# Patient Record
Sex: Female | Born: 1935
Health system: Southern US, Community
[De-identification: ages and names within clinical notes are randomized; demographics above are authoritative.]

## PROBLEM LIST (undated history)

## (undated) DIAGNOSIS — K219 Gastro-esophageal reflux disease without esophagitis: Secondary | ICD-10-CM

## (undated) DIAGNOSIS — J45909 Unspecified asthma, uncomplicated: Secondary | ICD-10-CM

## (undated) DIAGNOSIS — R531 Weakness: Secondary | ICD-10-CM

## (undated) DIAGNOSIS — J449 Chronic obstructive pulmonary disease, unspecified: Secondary | ICD-10-CM

## (undated) DIAGNOSIS — I10 Essential (primary) hypertension: Secondary | ICD-10-CM

## (undated) HISTORY — DX: Weakness: R53.1

## (undated) HISTORY — DX: Chronic obstructive pulmonary disease, unspecified: J44.9

## (undated) HISTORY — DX: Unspecified asthma, uncomplicated: J45.909

## (undated) HISTORY — DX: Gastro-esophageal reflux disease without esophagitis: K21.9

## (undated) HISTORY — DX: Essential (primary) hypertension: I10

## (undated) NOTE — *Deleted (*Deleted)
[facility] Renal Consultation Note    Indication for Consultation:  AKI  JYN:WGNF, Shea Evans, MD  HPI: Rachel Ho is a 81 y.o. female with a past medical history significant for asthma, COPD, HTN, and recent AAA (August 2021) admitted at the urging of her PCP for elevated BUN/Cr. Unknown baseline Cr, but BUN/Cr were found to be 116/4.04 on admission. AKI thought to be in the setting of steady decline of PO intake and overall functional ability to take care of herself. Patient also has been experiencing generalized weakness and 25-lb weight loss since June 2021. She states she has a good appetite and drinks a protein supplement at home. She denies difficulty swallowing or choking with food. She does use dentures. She also endorses taking 1 tablet Advil everyday. She reports the last time she took one was yesterday. Son, who is at bedside, says she recently had surgery for AAA in August but he does not know if she received any antibiotics at that time. She denies using any other drugs other than those listed on her medication list.   She says she saw a Kidney doctor once when she was pregnant because she "couldn't pee." She denies any known family history of kidney disease.  She currently lives alone, though she states "someone is always there."   During this admission, she has been receiving NS IVF @125mL /hr with improvement noted in her BUN and Cr this AM (87/3.36). Renal US performed and showed Bilateral echogenic parenchyma suggestive of chronic renal disease.  Her documented urine output, if accurate, is low. Urine canister was empty at time of visit. She is unable to say how much she has been urinating.  ROS: She endorses intermittent swelling in her lower extremities. She does have a morning cough and feels SOB in the morning, for which she takes her inhalers. She denies CP, orthopnea, nausea, vomiting, diarrhea, dysuria.  Past Medical History:  Diagnosis Date  . Asthma    . COPD (chronic obstructive pulmonary disease) (HCC)   . GERD (gastroesophageal reflux disease)   . High blood pressure    Past Surgical History:  Procedure Laterality Date  . ABDOMINAL HYSTERECTOMY  1984  . HERNIA REPAIR  1984   Family History  Problem Relation Age of Onset  . High blood pressure Mother    Social History:  reports that she quit smoking about 51 years ago. Her smoking use included cigarettes. She has a 50.00 pack-year smoking history. She has never used smokeless tobacco. She reports that she does not drink alcohol and does not use drugs. Allergies  Allergen Reactions  . Sulfa Antibiotics     Patient advised this was several years ago but she's had sulfur since then.    Prior to Admission medications   Medication Sig Start Date End Date Taking? Authorizing Provider  ASPIRIN LOW DOSE 81 MG EC tablet Take 81 mg by mouth daily. 07/10/20  Yes [provider]  ezetimibe (ZETIA) 10 MG tablet Take 10 mg by mouth daily. 09/14/20  Yes [provider]  fluconazole (DIFLUCAN) 100 MG tablet Take 100 mg by mouth daily.   Yes [provider]  furosemide (LASIX) 20 MG tablet Take 20 mg by mouth in the morning.  04/18/16  Yes [provider]  metoprolol (LOPRESSOR) 50 MG tablet Take 50 mg by mouth 2 (two) times daily.  05/03/16  Yes [provider]  olmesartan (BENICAR) 40 MG tablet Take 40 mg by mouth daily.  05/03/16  Yes  [provider]  Pitavastatin Calcium (LIVALO) 1 MG TABS Take 1 mg by mouth daily.   Yes [provider]  potassium chloride SA (K-DUR,KLOR-CON) 20 MEQ tablet Take 20 mEq by mouth daily.  07/14/16  Yes [provider]  PROAIR HFA 108 (90 Base) MCG/ACT inhaler Inhale two puffs every four to six hours as needed for cough or wheeze. Patient taking differently: Inhale 2 puffs into the lungs See admin instructions. Inhale two puffs every four to six hours as needed for cough or wheeze. 11/07/17  Yes  Kozlow, Alvira Philips, MD  SYMBICORT 160-4.5 MCG/ACT inhaler INHALE 2 PUFFS BY MOUTH 2 TIMES A DAY Patient taking differently: Inhale 2 puffs into the lungs in the morning and at bedtime.  04/07/17  Yes Kozlow, Alvira Philips, MD  tolterodine (DETROL LA) 2 MG 24 hr capsule Take 2 mg by mouth daily. 11/20/17  Yes [provider]  Cholecalciferol (VITAMIN D PO) Take by mouth daily.    [provider]  esomeprazole (NEXIUM) 40 MG capsule TAKE ONE CAPSULE BY MOUTH EVERY DAY Patient taking differently: Take 40 mg by mouth daily.  02/01/18   Kozlow, Alvira Philips, MD  Fluocinolone Acetonide 0.01 % OIL Place 1 drop in ear(s) daily as needed.    [provider]  fluticasone (FLONASE) 50 MCG/ACT nasal spray Place 2 sprays into both nostrils daily. 10/31/16   Kozlow, Alvira Philips, MD  LINZESS 145 MCG CAPS capsule as needed.  06/13/16   [provider]  loratadine (CLARITIN) 10 MG tablet Take 10 mg by mouth daily as needed for allergies.    [provider]  LUMIGAN 0.01 % SOLN  05/26/16   [provider]  mometasone (ELOCON) 0.1 % cream Apply to affected areas once daily until resolved Patient taking differently: Apply 1 application topically See admin instructions. Apply to affected areas once daily until resolved 01/25/17   Kozlow, Alvira Philips, MD  Multiple Vitamins-Calcium (ONE-A-DAY WOMENS PO) Take by mouth daily.    [provider]  nitroGLYCERIN (NITROSTAT) 0.4 MG SL tablet Place 0.4 mg under the tongue.    [provider]  oxybutynin (DITROPAN) 5 MG tablet  05/03/16   [provider]  rosuvastatin (CRESTOR) 10 MG tablet Take 10 mg by mouth daily. 07/10/20   [provider]  sodium chloride (SALINE MIST) 0.65 % nasal spray Place 1 spray into the nose as needed for congestion.    [provider]   Current Facility-Administered Medications  Medication Dose Route Frequency Provider Last Rate Last Admin  . 0.9 %  sodium chloride infusion    Intravenous Continuous Glade Lloyd, MD 125 mL/hr at 09/16/20 0600 New Bag at 09/16/20 0600  . albuterol (PROVENTIL) (2.5 MG/3ML) 0.083% nebulizer solution 2.5 mg  2.5 mg Inhalation Q6H PRN Cox, Amy N, DO      . aspirin EC tablet 81 mg  81 mg Oral Daily Cox, Amy N, DO   81 mg at 09/16/20 1055  . heparin injection 5,000 Units  5,000 Units Subcutaneous Q8H Cox, Amy N, DO   5,000 Units at 09/16/20 0538  . hydrALAZINE (APRESOLINE) injection 10 mg  10 mg Intravenous Q6H PRN Cox, Amy N, DO      . metoprolol tartrate (LOPRESSOR) tablet 50 mg  50 mg Oral BID Cox, Amy N, DO   50 mg at 09/15/20 2026  . nitroGLYCERIN (NITROSTAT) SL tablet 0.4 mg  0.4 mg Sublingual Q5 min PRN Cox, Amy N, DO      .  pantoprazole (PROTONIX) EC tablet 40 mg  40 mg Oral Daily Cox, Amy N, DO   40 mg at 09/16/20 1055   Labs: Basic Metabolic Panel: Recent Labs  Lab 09/14/20 1823 09/14/20 1823 09/15/20 0118 09/15/20 0819 09/16/20 0822  NA 137  --   --  142 141  K 5.0  --   --  4.6 3.7  CL 109  --   --  113* 115*  CO2 13*  --   --  19* 17*  GLUCOSE 130*  --   --  195* 107*  BUN 116*  --   --  112* 87*  CREATININE 4.04*   < > 4.14* 4.13* 3.36*  CALCIUM 12.0*  --   --  11.4* 10.0   < > = values in this interval not displayed.    Recent Labs  Lab 09/16/20 0822  AMMONIA 32   CBC: Recent Labs  Lab 09/14/20 1823 09/15/20 0118 09/16/20 0822  WBC 7.7 6.9 5.8  NEUTROABS  --   --  3.9  HGB 12.1 12.7 10.6*  HCT 37.9 39.8 33.2*  MCV 82.0 81.9 83.6  PLT 178 183 144*   Studies/Results: CT HEAD WO CONTRAST  Result Date: 09/15/2020 CLINICAL DATA:  66 year old female with delirium. EXAM: CT HEAD WITHOUT CONTRAST TECHNIQUE: Contiguous axial images were obtained from the base of the skull through the vertex without intravenous contrast. COMPARISON:  [facility] head CT 08/11/2019. FINDINGS: Brain: No midline shift, ventriculomegaly, mass effect, evidence of mass lesion, intracranial hemorrhage or evidence of  cortically based acute infarction. Cavum vergae, normal variant. Mild for age scattered white matter hypodensity. Otherwise normal gray-white matter differentiation. No cortical encephalomalacia identified. Vascular: Mild Calcified atherosclerosis at the skull base. No suspicious intracranial vascular hyperdensity. Skull: No acute osseous abnormality identified. Sinuses/Orbits: Visualized paranasal sinuses and mastoids are stable and well pneumatized. Other: No acute orbit or scalp soft tissue findings. IMPRESSION: 1. No acute intracranial abnormality. 2. Mild for age cerebral white matter changes, most commonly due to small vessel disease. Electronically Signed   By: Odessa Fleming M.D.   On: 09/15/2020 15:07   US Renal  Result Date: 09/15/2020 CLINICAL DATA:  Acute renal injury EXAM: RENAL / URINARY TRACT ULTRASOUND COMPLETE COMPARISON:  CT abdomen pelvis 10/23/2019. FINDINGS: Right Kidney: Renal measurements: 9.4 x 3.4 x 5.1 = volume: 84 mL. Increased echogenicity with prominent medullary pyramids. There is a 1.5 cm cystic lesion within the right kidney that is grossly stable in size compared to prior CT 2020. No solid mass or hydronephrosis visualized. Left Kidney: Renal measurements: 8.5 x 4.4 x 4 = volume: 79 mL. Increased echogenicity with prominent medullary pyramids. There is a 3.8 cm cystic lesion within the left kidney that is grossly similar slightly decreased in size compared to prior CT 2020. No solid mass or hydronephrosis visualized. Urinary bladder: Appears normal for degree of bladder distention. Other: None. IMPRESSION: 1. Bilateral echogenic parenchyma suggestive of chronic renal disease. 2. Otherwise grossly unremarkable renal ultrasound. Electronically Signed   By: Tish Frederickson M.D.   On: 09/15/2020 01:55    ROS: All others negative except those listed in HPI.  Physical Exam: Vitals:   09/15/20 1254 09/15/20 1621 09/16/20 0029 09/16/20 0540  BP: (!) 148/64 138/63 (!) 150/61 (!) 166/65   Pulse: 65 (!) 53 (!) 51 (!) 52  Resp: 13 14 16 18   Temp: 97.9 F (36.6 C) 98.5 F (36.9 C) 97.8 F (36.6 C) 97.6 F (36.4 C)  TempSrc: Oral  Oral Oral Oral  SpO2: 97% 96% 99% 99%  Weight:    45.6 kg  Height:         General: Elderly, frail African-American female sitting up in bed eating her lunch in NAD. Head: NCAT. Sclera not icteric. Moist mucus membranes. Neck: Supple. No lymphadenopathy Lungs: CTA bilaterally, anteriorly. No wheeze, rales or rhonchi. Breathing is unlabored. Heart: RRR. No murmur, rubs or gallops.  Abdomen: Soft, flat, and nontender, +BS, no guarding, no rebound tenderness.  Lower extremities: Trace edema in bilateral lower extremities. No ischemic changes or open wounds  Neuro: AAOx2. Moves all extremities spontaneously. Psych: Appropriate affect and cheerful mood. Responds to questions inappropriately at times.   Assessment/Plan: 1. AKI - likely pre-renal in the setting of dehydration and concurrent ARB/NSAID use. Improving with rehydration (4.04 --> 3.36). Continue to rehydrate with IVF @ 16mL/hr. Switch to LR to prevent further acidosis. Awaiting baseline labs from Dr. Juleen China (PCP). Will obtain urine studies.  2. Hypercalcemia - 12.0 on admission, 10.0 today. In the setting of increasing age and weight loss, will obtain SPEP and free light chains. Also PTH, Ca, Phos. 3. Hypertension/Volume - on Metoprolol 50mg  BID and Hydralazine PRN. Continue to hold Olmesartan and Furosemide while in AKI. Management per primary. 4. Chronic Anemia - Hgb 12.1 on admission, likely hemoconcentration. Down to 10.6 today. Management per primary.  Wendall Stade, PA-S2 [facility] 09/16/2020, 12:10 PM

## (undated) NOTE — *Deleted (*Deleted)
[facility] Progress Note   Subjective:   Patient seen and evaluated at bedside. She continues to feel better with each day. Her renal function is improving, Cr down to 2.62 today. Improving UOP. BP has been elevated the past day. On Metoprolol and prn Hydralazine.  Denies CP, HA, SOB, n/v/d.  Objective Vitals:   09/17/20 2031 09/18/20 0030 09/18/20 0603 09/18/20 0955  BP: (!) 149/62 (!) 169/72 (!) 151/131 (!) 148/63  Pulse: 69 (!) 59 66 64  Resp:  16 16   Temp:  97.6 F (36.4 C) 97.9 F (36.6 C)   TempSrc:  Oral Oral   SpO2: 100% 100% 98% 95%  Weight:      Height:       Physical Exam General:Elderly, frail African-American female lying down in bed in NAD. Head:NCAT. Sclera not icteric. Moist mucus membranes. Has temporal wasting Neck: Supple. No lymphadenopathy Lungs: CTA bilaterally, anteriorly. No wheeze, rales or rhonchi. Breathing is unlabored. Heart:RRR. No murmur, rubs or gallops.  Abdomen: Soft, flat, and nontender, +BS, no guarding, no rebound tenderness.  Lower extremities:Trace edema in bilateral lower extremities. No ischemic changes or open wounds  Neuro: AAOx2. Moves all extremities spontaneously. Psych: Appropriate affect and mood. Responds to questions inappropriately at times.   Filed Weights   09/14/20 1812 09/16/20 0540 09/17/20 0506  Weight: 54.4 kg 45.6 kg 47.6 kg    Intake/Output Summary (Last 24 hours) at 09/18/2020 1109 Last data filed at 09/18/2020 0659 Gross per 24 hour  Intake 1297.79 ml  Output 902 ml  Net 395.79 ml    Additional Objective Labs: Basic Metabolic Panel: Recent Labs  Lab 09/16/20 0822 09/16/20 1420 09/17/20 0035 09/18/20 0259  NA 141  --  143 139  K 3.7  --  4.1 3.7  CL 115*  --  116* 109  CO2 17*  --  18* 19*  GLUCOSE 107*  --  77 85  BUN 87*  --  79* 57*  CREATININE 3.36*  --  3.12* 2.62*  CALCIUM 10.0 9.3 9.7 9.6  PHOS  --  2.4*  --   --    Liver Function Tests: Recent Labs  Lab  09/17/20 0035  AST 87*  ALT 42  ALKPHOS 52  BILITOT 0.2*  PROT 5.9*  ALBUMIN 2.5*   CBC: Recent Labs  Lab 09/14/20 1823 09/14/20 1823 09/15/20 0118 09/16/20 0822 09/18/20 0259  WBC 7.7  --  6.9 5.8  --   NEUTROABS  --   --   --  3.9  --   HGB 12.1   < > 12.7 10.6* 10.2*  HCT 37.9   < > 39.8 33.2* 32.3*  MCV 82.0  --  81.9 83.6  --   PLT 178  --  183 144*  --    < > = values in this interval not displayed.   Medications: . lactated ringers 75 mL/hr at 09/17/20 1947   . aspirin EC  81 mg Oral Daily  . feeding supplement  237 mL Oral BID BM  . heparin  5,000 Units Subcutaneous Q8H  . levothyroxine  25 mcg Oral Q0600  . metoprolol tartrate  50 mg Oral BID  . pantoprazole  40 mg Oral Daily    Dialysis Orders:  Assessment/Plan: 38F with AKI with baseline Cr 0.9 in June 2021; UA w/o features of sig glomerular disease; and unintentional weight loss / FTT. Was hyercalcemic on admission. Since admit hydrated with NS, held diuretic/ARB. SCr and BUN improved from  admission. I suspect has a hypovolemic component but also some degree of CKD. Myeloma eval sent. Hold ARB and NSAIDs.  1. AKI -Improving with hydration and holding ARB. May stop LR now that patient is improving PO intake. Encourage adequate hydration. Continue to monitor RFP daily. Imaging w/o acute structural issues but a degree of medical renal disease. 2. Hypercalcemia mild/moderate - improving. Calcium 9.6 today. sFLC elevated consistent with low GFR. Pending SPEP. No sig cytopenias. PTH  - WNL.Phosphorous low at 2.5 - Improving. Gamma gap 3.4 - not elevated. 3. HTN - BP elevated, continue Metoprolol and PRN Hydralazine. Consider adding Amlodipine as needed. Continue holding ARB and Lasix. 4. Elevated TSH- newly discovered this admission. Started on synthroid. Management per primary. 5. COPD 6. FTT, unintentional weight loss - RD following. Pt likes food here, continue to optimize nutrition. 7. ? AAA  Wendall Stade, PA-S2 [facility] 09/18/2020,11:09 AM  LOS: 3 days

## (undated) NOTE — *Deleted (*Deleted)
[facility] Progress Note   Subjective:   Patient seen and examined at bedside. She is in good spirits and feels she is "on the mend." Her BUN/Cr have improved - down to 79/3.12 today. UOP only in the last 24hrs - question accuracy. She likes the "good food" here and reports good appetite.  Denies CP, SOB, nausea, vomiting, headache, diarrhea.  Objective Vitals:   09/16/20 1821 09/17/20 0025 09/17/20 0506 09/17/20 0929  BP: (!) 127/53 (!) 161/67 (!) 166/73   Pulse: 62 (!) 59 (!) 59 71  Resp: 16 18 18    Temp: (!) 97.3 F (36.3 C) 98.6 F (37 C) 98.4 F (36.9 C)   TempSrc: Oral Oral Oral   SpO2: 100% 100% 100%   Weight:   47.6 kg   Height:       Physical Exam General: Elderly, frail African-American female sitting up in bed eating her lunch in NAD. Head: NCAT. Sclera not icteric. Moist mucus membranes. Has temporal wasting Neck: Supple. No lymphadenopathy Lungs: CTA bilaterally, anteriorly. No wheeze, rales or rhonchi. Breathing is unlabored. Heart: RRR. No murmur, rubs or gallops.  Abdomen: Soft, flat, and nontender, +BS, no guarding, no rebound tenderness.  Lower extremities: Trace edema in bilateral lower extremities. No ischemic changes or open wounds  Neuro: AAOx2. Moves all extremities spontaneously. Psych:  Appropriate affect and mood. Responds to questions inappropriately at times.  Filed Weights   09/14/20 1812 09/16/20 0540 09/17/20 0506  Weight: 54.4 kg 45.6 kg 47.6 kg    Intake/Output Summary (Last 24 hours) at 09/17/2020 1157 Last data filed at 09/17/2020 0500 Gross per 24 hour  Intake 1355.11 ml  Output 200 ml  Net 1155.11 ml    Additional Objective Labs: Basic Metabolic Panel: Recent Labs  Lab 09/15/20 0819 09/15/20 0819 09/16/20 0822 09/16/20 1420 09/17/20 0035  NA 142  --  141  --  143  K 4.6  --  3.7  --  4.1  CL 113*  --  115*  --  116*  CO2 19*  --  17*  --  18*  GLUCOSE 195*  --  107*  --  77  BUN 112*  --  87*  --   79*  CREATININE 4.13*  --  3.36*  --  3.12*  CALCIUM 11.4*   < > 10.0 9.3 9.7  PHOS  --   --   --  2.4*  --    < > = values in this interval not displayed.   Liver Function Tests: Recent Labs  Lab 09/17/20 0035  AST 87*  ALT 42  ALKPHOS 52  BILITOT 0.2*  PROT 5.9*  ALBUMIN 2.5*   CBC: Recent Labs  Lab 09/14/20 1823 09/15/20 0118 09/16/20 0822  WBC 7.7 6.9 5.8  NEUTROABS  --   --  3.9  HGB 12.1 12.7 10.6*  HCT 37.9 39.8 33.2*  MCV 82.0 81.9 83.6  PLT 178 183 144*   Studies/Results: CT HEAD WO CONTRAST  Result Date: 09/15/2020 CLINICAL DATA:  37 year old female with delirium. EXAM: CT HEAD WITHOUT CONTRAST TECHNIQUE: Contiguous axial images were obtained from the base of the skull through the vertex without intravenous contrast. COMPARISON:  [facility] head CT 08/11/2019. FINDINGS: Brain: No midline shift, ventriculomegaly, mass effect, evidence of mass lesion, intracranial hemorrhage or evidence of cortically based acute infarction. Cavum vergae, normal variant. Mild for age scattered white matter hypodensity. Otherwise normal gray-white matter differentiation. No cortical encephalomalacia identified. Vascular: Mild Calcified atherosclerosis at the  skull base. No suspicious intracranial vascular hyperdensity. Skull: No acute osseous abnormality identified. Sinuses/Orbits: Visualized paranasal sinuses and mastoids are stable and well pneumatized. Other: No acute orbit or scalp soft tissue findings. IMPRESSION: 1. No acute intracranial abnormality. 2. Mild for age cerebral white matter changes, most commonly due to small vessel disease. Electronically Signed   By: Odessa Fleming M.D.   On: 09/15/2020 15:07    Medications: . lactated ringers 75 mL/hr at 09/17/20 0440   . aspirin EC  81 mg Oral Daily  . feeding supplement  237 mL Oral BID BM  . heparin  5,000 Units Subcutaneous Q8H  . metoprolol tartrate  50 mg Oral BID  . pantoprazole  40 mg Oral Daily    Assessment/Plan:  62F with AKI with baseline Cr 0.9 in June 2021; UA w/o features of sig glomerular disease; and unintentional weight loss / FTT.  Was hyercalcemic on admission. Since admit hydrated with NS, held diuretic/ARB.  SCr and BUN improved from admission. I suspect has a hypovolemic component but also some degree of CKD. Myeloma eval sent. Hold ARB and NSAIDs.   1. AKI - Improving with hydration and holding ARB. Continue LR for another 24hrs and monitor RFP daily. Imaging w/o acute structural issues but a degree of medical renal disease. UA w/o hematuria/proteinuria. 2. Hypercalcemia mild/moderate - improving. Corrected calcium 10.9 today. Sent SPEP, sFLC.  No sig cytopenias. PTH and PTHrP - WNL. Phosphorous low at 2.5 - suspect this is nutritional. Gamma gap 3.4 - not elevated. 3. HTN - BP ok here, holding meds 4. Hypothyroidism - newly discovered this admission. Management per primary. 5. COPD 6. FTT, unintentional weight loss - RD following. Pt likes food here, continue to optimize nutrition. 7. ? AAA  Wendall Stade, PA-S2 [facility] 09/17/2020,11:57 AM  LOS: 2 days

---

## 1982-12-05 HISTORY — PX: ABDOMINAL HYSTERECTOMY: SHX81

## 1982-12-05 HISTORY — PX: HERNIA REPAIR: SHX51

## 2014-12-08 DIAGNOSIS — J309 Allergic rhinitis, unspecified: Secondary | ICD-10-CM | POA: Diagnosis not present

## 2014-12-08 DIAGNOSIS — L853 Xerosis cutis: Secondary | ICD-10-CM | POA: Diagnosis not present

## 2014-12-08 DIAGNOSIS — J4540 Moderate persistent asthma, uncomplicated: Secondary | ICD-10-CM | POA: Diagnosis not present

## 2014-12-08 DIAGNOSIS — L300 Nummular dermatitis: Secondary | ICD-10-CM | POA: Diagnosis not present

## 2015-01-09 DIAGNOSIS — R233 Spontaneous ecchymoses: Secondary | ICD-10-CM | POA: Diagnosis not present

## 2015-01-19 DIAGNOSIS — R300 Dysuria: Secondary | ICD-10-CM | POA: Diagnosis not present

## 2015-01-20 DIAGNOSIS — R300 Dysuria: Secondary | ICD-10-CM | POA: Diagnosis not present

## 2015-01-26 DIAGNOSIS — Z1231 Encounter for screening mammogram for malignant neoplasm of breast: Secondary | ICD-10-CM | POA: Diagnosis not present

## 2015-01-30 DIAGNOSIS — R928 Other abnormal and inconclusive findings on diagnostic imaging of breast: Secondary | ICD-10-CM | POA: Diagnosis not present

## 2015-03-02 DIAGNOSIS — J45909 Unspecified asthma, uncomplicated: Secondary | ICD-10-CM | POA: Diagnosis not present

## 2015-03-02 DIAGNOSIS — E782 Mixed hyperlipidemia: Secondary | ICD-10-CM | POA: Diagnosis not present

## 2015-03-02 DIAGNOSIS — Z79899 Other long term (current) drug therapy: Secondary | ICD-10-CM | POA: Diagnosis not present

## 2015-03-02 DIAGNOSIS — I10 Essential (primary) hypertension: Secondary | ICD-10-CM | POA: Diagnosis not present

## 2015-03-02 DIAGNOSIS — Z0000 Encounter for general adult medical examination without abnormal findings: Secondary | ICD-10-CM | POA: Diagnosis not present

## 2015-03-02 DIAGNOSIS — Z9181 History of falling: Secondary | ICD-10-CM | POA: Diagnosis not present

## 2015-03-05 DIAGNOSIS — H5210 Myopia, unspecified eye: Secondary | ICD-10-CM | POA: Diagnosis not present

## 2015-03-05 DIAGNOSIS — H40011 Open angle with borderline findings, low risk, right eye: Secondary | ICD-10-CM | POA: Diagnosis not present

## 2015-03-05 DIAGNOSIS — H5203 Hypermetropia, bilateral: Secondary | ICD-10-CM | POA: Diagnosis not present

## 2015-03-09 DIAGNOSIS — J45901 Unspecified asthma with (acute) exacerbation: Secondary | ICD-10-CM | POA: Diagnosis not present

## 2015-04-09 DIAGNOSIS — H9209 Otalgia, unspecified ear: Secondary | ICD-10-CM | POA: Diagnosis not present

## 2015-04-09 DIAGNOSIS — H6123 Impacted cerumen, bilateral: Secondary | ICD-10-CM | POA: Diagnosis not present

## 2015-04-09 DIAGNOSIS — M2660 Temporomandibular joint disorder, unspecified: Secondary | ICD-10-CM | POA: Diagnosis not present

## 2015-06-03 DIAGNOSIS — B379 Candidiasis, unspecified: Secondary | ICD-10-CM | POA: Diagnosis not present

## 2015-06-15 DIAGNOSIS — E785 Hyperlipidemia, unspecified: Secondary | ICD-10-CM | POA: Insufficient documentation

## 2015-06-15 DIAGNOSIS — I10 Essential (primary) hypertension: Secondary | ICD-10-CM | POA: Diagnosis not present

## 2015-06-17 DIAGNOSIS — Z961 Presence of intraocular lens: Secondary | ICD-10-CM | POA: Diagnosis not present

## 2015-06-17 DIAGNOSIS — H4011X2 Primary open-angle glaucoma, moderate stage: Secondary | ICD-10-CM | POA: Diagnosis not present

## 2015-07-06 DIAGNOSIS — J309 Allergic rhinitis, unspecified: Secondary | ICD-10-CM | POA: Diagnosis not present

## 2015-07-06 DIAGNOSIS — J4540 Moderate persistent asthma, uncomplicated: Secondary | ICD-10-CM | POA: Diagnosis not present

## 2015-09-09 DIAGNOSIS — H401222 Low-tension glaucoma, left eye, moderate stage: Secondary | ICD-10-CM | POA: Diagnosis not present

## 2015-09-09 DIAGNOSIS — H401212 Low-tension glaucoma, right eye, moderate stage: Secondary | ICD-10-CM | POA: Diagnosis not present

## 2015-09-16 DIAGNOSIS — J309 Allergic rhinitis, unspecified: Secondary | ICD-10-CM | POA: Insufficient documentation

## 2015-09-16 DIAGNOSIS — J310 Chronic rhinitis: Secondary | ICD-10-CM | POA: Insufficient documentation

## 2015-09-16 DIAGNOSIS — J449 Chronic obstructive pulmonary disease, unspecified: Secondary | ICD-10-CM | POA: Insufficient documentation

## 2015-09-16 DIAGNOSIS — K219 Gastro-esophageal reflux disease without esophagitis: Secondary | ICD-10-CM | POA: Insufficient documentation

## 2015-09-16 HISTORY — DX: Chronic rhinitis: J31.0

## 2015-09-16 HISTORY — DX: Allergic rhinitis, unspecified: J30.9

## 2015-10-07 DIAGNOSIS — H401212 Low-tension glaucoma, right eye, moderate stage: Secondary | ICD-10-CM | POA: Diagnosis not present

## 2015-10-07 DIAGNOSIS — Z961 Presence of intraocular lens: Secondary | ICD-10-CM | POA: Diagnosis not present

## 2015-10-07 DIAGNOSIS — H401222 Low-tension glaucoma, left eye, moderate stage: Secondary | ICD-10-CM | POA: Diagnosis not present

## 2015-12-08 DIAGNOSIS — R7309 Other abnormal glucose: Secondary | ICD-10-CM | POA: Diagnosis not present

## 2015-12-08 DIAGNOSIS — E782 Mixed hyperlipidemia: Secondary | ICD-10-CM | POA: Diagnosis not present

## 2015-12-08 DIAGNOSIS — Z79899 Other long term (current) drug therapy: Secondary | ICD-10-CM | POA: Diagnosis not present

## 2015-12-08 DIAGNOSIS — I10 Essential (primary) hypertension: Secondary | ICD-10-CM | POA: Diagnosis not present

## 2015-12-23 DIAGNOSIS — I10 Essential (primary) hypertension: Secondary | ICD-10-CM | POA: Diagnosis not present

## 2015-12-23 DIAGNOSIS — E785 Hyperlipidemia, unspecified: Secondary | ICD-10-CM | POA: Diagnosis not present

## 2016-01-13 DIAGNOSIS — H401222 Low-tension glaucoma, left eye, moderate stage: Secondary | ICD-10-CM | POA: Diagnosis not present

## 2016-01-13 DIAGNOSIS — H401212 Low-tension glaucoma, right eye, moderate stage: Secondary | ICD-10-CM | POA: Diagnosis not present

## 2016-01-18 ENCOUNTER — Encounter: Payer: Self-pay | Admitting: Allergy and Immunology

## 2016-01-18 ENCOUNTER — Ambulatory Visit (INDEPENDENT_AMBULATORY_CARE_PROVIDER_SITE_OTHER): Payer: Medicare Other | Admitting: Allergy and Immunology

## 2016-01-18 VITALS — BP 140/78 | HR 74 | Resp 16 | Ht 62.99 in | Wt 137.1 lb

## 2016-01-18 DIAGNOSIS — J45909 Unspecified asthma, uncomplicated: Secondary | ICD-10-CM | POA: Diagnosis not present

## 2016-01-18 DIAGNOSIS — J387 Other diseases of larynx: Secondary | ICD-10-CM | POA: Diagnosis not present

## 2016-01-18 DIAGNOSIS — J3089 Other allergic rhinitis: Secondary | ICD-10-CM

## 2016-01-18 DIAGNOSIS — J449 Chronic obstructive pulmonary disease, unspecified: Secondary | ICD-10-CM | POA: Diagnosis not present

## 2016-01-18 DIAGNOSIS — K219 Gastro-esophageal reflux disease without esophagitis: Secondary | ICD-10-CM

## 2016-01-18 NOTE — Patient Instructions (Signed)
  1. Continue Symbicort 160 - 2 inhalations twice a day  2. Continue nasal fluticasone one spray each nostril one-2 times a day  3. Continue Nexium 40 mg in the morning  4. May add ranitidine 300 mg in the evening if needed  5. May add Claritin 10 mg once a day if needed  6. May add Xopenex HFA 2 puffs every 4-6 hours if needed  7. Return to clinic in 6 months or earlier if problem

## 2016-01-18 NOTE — Progress Notes (Signed)
Follow-up Note  Referring Provider: No ref. provider found Primary Provider: Ronita Hipps, MD Date of Office Visit: 01/18/2016  Subjective:   Rachel Ho (DOB: January 28, 1935) is a 81 y.o. female who returns to the [facility] on 01/18/2016 in re-evaluation of the following:  HPI Comments: Rachel Ho presents this clinic in reevaluation of her obstructive lung disease with component of asthma, allergic and nonallergic rhinitis, and LPR. I've not seen her in his clinic in 6 months.  Rachel Ho is done very well on her current medical therapy. She's had very little problems with her breathing while using Symbicort 160 2 inhalations mostly one time per day but occasionally twice a day. Rarely does she use Xopenex HFA and she can exert herself without any problem and she has not acquired any systemic steroid to treat an exacerbation these past 6 months. She did receive the flu vaccine this year and she received her Prevnar booster in 2015.  Rachel Ho is had very little problems with her nose as long as she continues to use nasal fluticasone every day. If she runs out of nasal fluticasone she'll use over-the-counter Rhinocort.  Rachel Ho is had very good control of her reflux or using her Nexium 40 mg in the morning and she is no longer using any ranitidine at nighttime.   Current Outpatient Prescriptions on File Prior to Visit  Medication Sig Dispense Refill  . budesonide-formoterol (SYMBICORT) 160-4.5 MCG/ACT inhaler Inhale 2 puffs into the lungs 2 (two) times daily.    Marland Kitchen esomeprazole (NEXIUM) 40 MG capsule Take 40 mg by mouth daily at 12 noon.    . Fluocinolone Acetonide 0.01 % OIL Place 1 drop in ear(s) daily as needed.    . fluticasone (VERAMYST) 27.5 MCG/SPRAY nasal spray Place 1 spray into the nose daily.    . FUROSEMIDE PO Take by mouth.    . levalbuterol (XOPENEX HFA) 45 MCG/ACT inhaler Inhale 2 puffs into the lungs every 4 (four) hours as needed for wheezing.    Marland Kitchen loratadine  (CLARITIN) 10 MG tablet Take 10 mg by mouth daily as needed for allergies.    Marland Kitchen METOPROLOL TARTRATE PO Take by mouth.    . montelukast (SINGULAIR) 10 MG tablet Take 10 mg by mouth daily. Reported on 01/18/2016    . Olmesartan Medoxomil (BENICAR PO) Take by mouth.    . OXYBUTYNIN TD Place onto the skin.    Marland Kitchen POTASSIUM PO Take by mouth daily.    . ranitidine (ZANTAC) 300 MG tablet Take 300 mg by mouth daily as needed for heartburn.    . sodium chloride (SALINE MIST) 0.65 % nasal spray Place 1 spray into the nose as needed for congestion.     No current facility-administered medications on file prior to visit.     Past Medical History  Diagnosis Date  . Asthma   . GERD (gastroesophageal reflux disease)   . High blood pressure     Past Surgical History  Procedure Laterality Date  . Abdominal hysterectomy  1984  . Hernia repair  1984    Allergies  Allergen Reactions  . Sulfa Antibiotics     Review of systems negative except as noted in HPI / PMHx or noted below:  Review of Systems  Constitutional: Negative.   HENT: Positive for hearing loss (Scheduled for a hearing evaluation this coming month).   Eyes: Negative.   Respiratory: Negative.   Cardiovascular: Negative.   Gastrointestinal: Negative.   Genitourinary: Negative.   Musculoskeletal: Negative.  Skin: Negative.   Neurological: Negative.   Endo/Heme/Allergies: Negative.   Psychiatric/Behavioral: Negative.      Objective:   Filed Vitals:   01/18/16 1103  BP: 140/78  Pulse: 74  Resp: 16   Height: 5' 2.99" (160 cm)  Weight: 137 lb 2 oz (62.2 kg)   Physical Exam  Constitutional: She is well-developed, well-nourished, and in no distress.  HENT:  Head: Normocephalic.  Right Ear: Tympanic membrane, external ear and ear canal normal.  Left Ear: Tympanic membrane, external ear and ear canal normal.  Nose: Nose normal. No mucosal edema or rhinorrhea.  Mouth/Throat: Uvula is midline, oropharynx is clear and  moist and mucous membranes are normal. No oropharyngeal exudate.  Eyes: Conjunctivae are normal.  Neck: Trachea normal. No tracheal tenderness present. No tracheal deviation present. No thyromegaly present.  Cardiovascular: Normal rate, regular rhythm, S1 normal, S2 normal and normal heart sounds.   No murmur heard. Pulmonary/Chest: Breath sounds normal. No stridor. No respiratory distress. She has no wheezes. She has no rales.  Musculoskeletal: She exhibits no edema.  Lymphadenopathy:       Head (right side): No tonsillar adenopathy present.       Head (left side): No tonsillar adenopathy present.    She has no cervical adenopathy.    She has no axillary adenopathy.  Neurological: She is alert. Gait normal.  Skin: No rash noted. She is not diaphoretic. No erythema. Nails show no clubbing.  Psychiatric: Mood and affect normal.    Diagnostics:    Spirometry was performed and demonstrated an FEV1 of 0.77 at 51 % of predicted.  The patient had an Asthma Control Test with the following results: ACT Total Score: 16.    Assessment and Plan:   1. COPD with asthma ([place])   2. LPRD (laryngopharyngeal reflux disease)   3. Other allergic rhinitis     1. Continue Symbicort 160 - 2 inhalations twice a day  2. Continue nasal fluticasone one spray each nostril one-2 times a day  3. Continue Nexium 40 mg in the morning  4. May add ranitidine 300 mg in the evening if needed  5. May add Claritin 10 mg once a day if needed  6. May add Xopenex HFA 2 puffs every 4-6 hours if needed  7. Return to clinic in 6 months or earlier if problem  Rachel Ho has done very well on her current plan and I see no need for changing this plan at this point. She has a very good understanding of her condition and how to use the medications appropriately. I will see her back in this clinic in approximately 6 months or earlier if there is a problem.  Allena Katz, MD [place]

## 2016-02-01 ENCOUNTER — Other Ambulatory Visit: Payer: Self-pay | Admitting: *Deleted

## 2016-02-01 MED ORDER — BUDESONIDE-FORMOTEROL FUMARATE 160-4.5 MCG/ACT IN AERO: 2.0000 | INHALATION_SPRAY | Freq: Two times a day (BID) | RESPIRATORY_TRACT | Status: DC

## 2016-02-10 DIAGNOSIS — Z1231 Encounter for screening mammogram for malignant neoplasm of breast: Secondary | ICD-10-CM | POA: Diagnosis not present

## 2016-02-19 ENCOUNTER — Other Ambulatory Visit: Payer: Self-pay

## 2016-02-19 MED ORDER — ESOMEPRAZOLE MAGNESIUM 40 MG PO CPDR: 40.0000 mg | DELAYED_RELEASE_CAPSULE | Freq: Every day | ORAL | Status: DC

## 2016-03-09 DIAGNOSIS — B379 Candidiasis, unspecified: Secondary | ICD-10-CM | POA: Diagnosis not present

## 2016-03-09 DIAGNOSIS — Z0000 Encounter for general adult medical examination without abnormal findings: Secondary | ICD-10-CM | POA: Diagnosis not present

## 2016-03-09 DIAGNOSIS — Z1389 Encounter for screening for other disorder: Secondary | ICD-10-CM | POA: Diagnosis not present

## 2016-03-09 DIAGNOSIS — Z9181 History of falling: Secondary | ICD-10-CM | POA: Diagnosis not present

## 2016-04-22 DIAGNOSIS — H401212 Low-tension glaucoma, right eye, moderate stage: Secondary | ICD-10-CM | POA: Diagnosis not present

## 2016-04-22 DIAGNOSIS — H401222 Low-tension glaucoma, left eye, moderate stage: Secondary | ICD-10-CM | POA: Diagnosis not present

## 2016-07-20 ENCOUNTER — Encounter: Payer: Self-pay | Admitting: Allergy and Immunology

## 2016-07-20 ENCOUNTER — Ambulatory Visit (INDEPENDENT_AMBULATORY_CARE_PROVIDER_SITE_OTHER): Payer: Medicare Other | Admitting: Allergy and Immunology

## 2016-07-20 VITALS — BP 140/78 | HR 76 | Resp 18

## 2016-07-20 DIAGNOSIS — Z79899 Other long term (current) drug therapy: Secondary | ICD-10-CM | POA: Diagnosis not present

## 2016-07-20 DIAGNOSIS — J3089 Other allergic rhinitis: Secondary | ICD-10-CM | POA: Diagnosis not present

## 2016-07-20 DIAGNOSIS — R32 Unspecified urinary incontinence: Secondary | ICD-10-CM | POA: Diagnosis not present

## 2016-07-20 DIAGNOSIS — Z13820 Encounter for screening for osteoporosis: Secondary | ICD-10-CM | POA: Diagnosis not present

## 2016-07-20 DIAGNOSIS — E782 Mixed hyperlipidemia: Secondary | ICD-10-CM | POA: Diagnosis not present

## 2016-07-20 DIAGNOSIS — J45909 Unspecified asthma, uncomplicated: Secondary | ICD-10-CM

## 2016-07-20 DIAGNOSIS — R7309 Other abnormal glucose: Secondary | ICD-10-CM | POA: Diagnosis not present

## 2016-07-20 DIAGNOSIS — M8580 Other specified disorders of bone density and structure, unspecified site: Secondary | ICD-10-CM | POA: Diagnosis not present

## 2016-07-20 DIAGNOSIS — Z0000 Encounter for general adult medical examination without abnormal findings: Secondary | ICD-10-CM | POA: Diagnosis not present

## 2016-07-20 DIAGNOSIS — J387 Other diseases of larynx: Secondary | ICD-10-CM

## 2016-07-20 DIAGNOSIS — K219 Gastro-esophageal reflux disease without esophagitis: Secondary | ICD-10-CM

## 2016-07-20 DIAGNOSIS — J449 Chronic obstructive pulmonary disease, unspecified: Secondary | ICD-10-CM

## 2016-07-20 DIAGNOSIS — I10 Essential (primary) hypertension: Secondary | ICD-10-CM | POA: Diagnosis not present

## 2016-07-20 NOTE — Progress Notes (Signed)
Follow-up Note  Referring Provider: Ronita Hipps, MD Primary Provider: Ronita Hipps, MD Date of Office Visit: 07/20/2016  Subjective:   Rachel Ho (DOB: 1935-09-20) is a 81 y.o. female who returns to the [facility] on 07/20/2016 in re-evaluation of the following:  HPI: Rachel Ho returns to this clinic in reevaluation of her obstructive lung disease with component of asthma, allergic and nonallergic rhinitis and LPR. I last saw her in his clinic in February 2017.  During the interval she has not had any exacerbations of her asthma requiring a systemic steroid while consistently using Symbicort 160 mostly one time a day. Rarely does she use a short acting bronchodilator. She can exert herself without much problem. She's lost approximately 50 pounds of weight in the past 2 years voluntarily and she thinks that this is really resulted in great improvement regarding her lung status.  Her nose has been doing quite well while using nasal fluticasone. She has not had any episodes of sinusitis requiring an antibiotic.  Her reflux has been under excellent control while using Nexium 40 mg a day in the morning. She has not had added and ranitidine at nighttime.  She received the flu vaccine today.    Medication List      budesonide-formoterol 160-4.5 MCG/ACT inhaler Commonly known as:  SYMBICORT Inhale 2 puffs into the lungs 2 (two) times daily.   CLARISPRAY 50 MCG/ACT nasal spray Generic drug:  fluticasone Place 1 spray into both nostrils daily.   esomeprazole 40 MG capsule Commonly known as:  NEXIUM Take 1 capsule (40 mg total) by mouth daily at 12 noon.   Fluocinolone Acetonide 0.01 % Oil Place 1 drop in ear(s) daily as needed.   furosemide 20 MG tablet Commonly known as:  LASIX   levalbuterol 45 MCG/ACT inhaler Commonly known as:  XOPENEX HFA Inhale 2 puffs into the lungs every 4 (four) hours as needed for wheezing.   LINZESS 145 MCG Caps  capsule Generic drug:  linaclotide as needed.   loratadine 10 MG tablet Commonly known as:  CLARITIN Take 10 mg by mouth daily as needed for allergies.   LUMIGAN 0.01 % Soln Generic drug:  bimatoprost   metoprolol 50 MG tablet Commonly known as:  LOPRESSOR   nitroGLYCERIN 0.4 MG SL tablet Commonly known as:  NITROSTAT Place 0.4 mg under the tongue.   olmesartan 40 MG tablet Commonly known as:  BENICAR Take 40 mg by mouth daily.   ONE-A-DAY WOMENS PO Take by mouth daily.   oxybutynin 5 MG tablet Commonly known as:  DITROPAN   potassium chloride SA 20 MEQ tablet Commonly known as:  K-DUR,KLOR-CON   SALINE MIST 0.65 % nasal spray Generic drug:  sodium chloride Place 1 spray into the nose as needed for congestion.   VITAMIN D PO Take by mouth daily.       Past Medical History:  Diagnosis Date  . Asthma   . GERD (gastroesophageal reflux disease)   . High blood pressure     Past Surgical History:  Procedure Laterality Date  . ABDOMINAL HYSTERECTOMY  1984  . HERNIA REPAIR  1984    Allergies  Allergen Reactions  . Sulfa Antibiotics     Review of systems negative except as noted in HPI / PMHx or noted below:  Review of Systems  Constitutional: Negative.   HENT: Negative.   Eyes: Negative.   Respiratory: Negative.   Cardiovascular: Negative.   Gastrointestinal: Negative.   Genitourinary: Negative.  Musculoskeletal: Negative.   Skin: Negative.   Neurological: Negative.   Endo/Heme/Allergies: Negative.   Psychiatric/Behavioral: Negative.      Objective:   Vitals:   07/20/16 1457  BP: 140/78  Pulse: 76  Resp: 18          Physical Exam  Constitutional: She is well-developed, well-nourished, and in no distress.  HENT:  Head: Normocephalic.  Right Ear: Tympanic membrane, external ear and ear canal normal.  Left Ear: Tympanic membrane, external ear and ear canal normal.  Nose: Nose normal. No mucosal edema or rhinorrhea.  Mouth/Throat:  Uvula is midline, oropharynx is clear and moist and mucous membranes are normal. No oropharyngeal exudate.  Eyes: Conjunctivae are normal.  Neck: Trachea normal. No tracheal tenderness present. No tracheal deviation present. No thyromegaly present.  Cardiovascular: Normal rate, regular rhythm, S1 normal, S2 normal and normal heart sounds.   No murmur heard. Pulmonary/Chest: Breath sounds normal. No stridor. No respiratory distress. She has no wheezes. She has no rales.  Musculoskeletal: She exhibits no edema.  Lymphadenopathy:       Head (right side): No tonsillar adenopathy present.       Head (left side): No tonsillar adenopathy present.    She has no cervical adenopathy.  Neurological: She is alert. Gait normal.  Skin: No rash noted. She is not diaphoretic. No erythema. Nails show no clubbing.  Psychiatric: Mood and affect normal.    Diagnostics:    Spirometry was performed and demonstrated an FEV1 of 0.75 at 50 % of predicted.  The patient had an Asthma Control Test with the following results: ACT Total Score: 20.    Assessment and Plan:   1. COPD with asthma ([place])   2. Other allergic rhinitis   3. LPRD (laryngopharyngeal reflux disease)     1. Continue Symbicort 160 - 2 inhalations one - two times a day  2. Continue nasal fluticasone one spray each nostril one-2 times a day  3. Continue Nexium 40 mg in the morning  4. May add ranitidine 300 mg in the evening if needed  5. May add Claritin 10 mg once a day if needed  6. May add Xopenex HFA or ProAir Respiclick 2 puffs every 4-6 hours if needed  7. Return to clinic in 6 months or earlier if problem  Rachel Ho is doing quite well at this point in time and she will continue to use anti-inflammatory medications for both her upper and lower airway. As well, she will continue on antireflux therapy as stated above. She will contact me should there be a significant problem and she moves forward but otherwise I'll just see her back  in this clinic in approximately 6 months.  Allena Katz, MD [place]

## 2016-07-20 NOTE — Patient Instructions (Addendum)
  1. Continue Symbicort 160 - 2 inhalations one - two times a day  2. Continue nasal fluticasone one spray each nostril one-2 times a day  3. Continue Nexium 40 mg in the morning  4. May add ranitidine 300 mg in the evening if needed  5. May add Claritin 10 mg once a day if needed  6. May add Xopenex HFA or ProAir Respiclick 2 puffs every 4-6 hours if needed  7. Return to clinic in 6 months or earlier if problem

## 2016-08-01 DIAGNOSIS — I10 Essential (primary) hypertension: Secondary | ICD-10-CM | POA: Diagnosis not present

## 2016-08-01 DIAGNOSIS — E785 Hyperlipidemia, unspecified: Secondary | ICD-10-CM | POA: Diagnosis not present

## 2016-08-04 ENCOUNTER — Other Ambulatory Visit: Payer: Self-pay | Admitting: *Deleted

## 2016-08-04 MED ORDER — ESOMEPRAZOLE MAGNESIUM 40 MG PO CPDR: 40.0000 mg | DELAYED_RELEASE_CAPSULE | Freq: Every day | ORAL | 5 refills | Status: DC

## 2016-09-19 ENCOUNTER — Other Ambulatory Visit: Payer: Self-pay | Admitting: *Deleted

## 2016-09-19 ENCOUNTER — Other Ambulatory Visit: Payer: Self-pay | Admitting: Allergy and Immunology

## 2016-09-19 MED ORDER — ESOMEPRAZOLE MAGNESIUM 40 MG PO CPDR: 40.0000 mg | DELAYED_RELEASE_CAPSULE | Freq: Every day | ORAL | 3 refills | Status: DC

## 2016-09-19 NOTE — Telephone Encounter (Signed)
Needs refills on [place]

## 2016-09-19 NOTE — Telephone Encounter (Signed)
Rx sent to pharmacy   

## 2016-10-31 ENCOUNTER — Other Ambulatory Visit: Payer: Self-pay | Admitting: *Deleted

## 2016-10-31 DIAGNOSIS — M7072 Other bursitis of hip, left hip: Secondary | ICD-10-CM | POA: Diagnosis not present

## 2016-10-31 MED ORDER — FLUTICASONE PROPIONATE 50 MCG/ACT NA SUSP: 2.0000 | Freq: Every day | NASAL | 3 refills | Status: DC

## 2016-12-01 ENCOUNTER — Other Ambulatory Visit: Payer: Self-pay

## 2016-12-01 ENCOUNTER — Telehealth: Payer: Self-pay | Admitting: Allergy and Immunology

## 2016-12-01 MED ORDER — LEVALBUTEROL TARTRATE 45 MCG/ACT IN AERO: 2.0000 | INHALATION_SPRAY | RESPIRATORY_TRACT | 0 refills | Status: DC | PRN

## 2016-12-01 NOTE — Telephone Encounter (Signed)
Needs a refill on Ventolin inhaler.  [facility]

## 2017-01-25 ENCOUNTER — Encounter: Payer: Self-pay | Admitting: Allergy and Immunology

## 2017-01-25 ENCOUNTER — Ambulatory Visit (INDEPENDENT_AMBULATORY_CARE_PROVIDER_SITE_OTHER): Payer: Medicare Other | Admitting: Allergy and Immunology

## 2017-01-25 VITALS — BP 140/74 | HR 60 | Resp 18

## 2017-01-25 DIAGNOSIS — I10 Essential (primary) hypertension: Secondary | ICD-10-CM | POA: Diagnosis not present

## 2017-01-25 DIAGNOSIS — J449 Chronic obstructive pulmonary disease, unspecified: Secondary | ICD-10-CM

## 2017-01-25 DIAGNOSIS — K219 Gastro-esophageal reflux disease without esophagitis: Secondary | ICD-10-CM | POA: Diagnosis not present

## 2017-01-25 DIAGNOSIS — J3089 Other allergic rhinitis: Secondary | ICD-10-CM

## 2017-01-25 DIAGNOSIS — L308 Other specified dermatitis: Secondary | ICD-10-CM | POA: Diagnosis not present

## 2017-01-25 DIAGNOSIS — L989 Disorder of the skin and subcutaneous tissue, unspecified: Secondary | ICD-10-CM

## 2017-01-25 DIAGNOSIS — E785 Hyperlipidemia, unspecified: Secondary | ICD-10-CM | POA: Diagnosis not present

## 2017-01-25 MED ORDER — MOMETASONE FUROATE 0.1 % EX CREA: TOPICAL_CREAM | CUTANEOUS | 1 refills | Status: DC

## 2017-01-25 NOTE — Progress Notes (Signed)
Follow-up Note  Referring Provider: Ronita Hipps, MD Primary Provider: Ronita Hipps, MD Date of Office Visit: 01/25/2017  Subjective:   Rachel Ho (DOB: 1935/03/08) is a 82 y.o. female who returns to the [facility] [place] on 01/25/2017 in re-evaluation of the following:  HPI: Rachel Ho returns to this clinic in reevaluation of her COPD with asthma and allergic rhinitis and LPR and a dermatitis. I've not seen her in this clinic since August 2017.  During the interval she has done very well with her COPD with asthma and has not required a systemic steroid to treat an exacerbation. Her requirement for short acting bronchodilator averages out about 3 or 4 times a week. She continues to use Symbicort just one time per day at this point in time. For the most part she can exert himself without much problem.  She has done well regarding her upper airways while using nasal fluticasone. She has not required an antibiotic to treat an episode of sinusitis.  Her reflux has been under excellent control while consistently using her Nexium. She's had no issues with her throat. She has no need to use ranitidine.  Rachel Ho has been having a problem with a dermatitis involving her left shin. This is a long-standing issue and she went to [facility] in evaluation of this issue and was told that she may have psoriasis. She's been treated with multiple agents in the past and she thinks that some of these agents may have helped in the past but it's been many years since she's treated this issue..  She did obtain the flu vaccine this year. She has had the shingles vaccine and the Prevnar booster apparently.  Allergies as of 01/25/2017      Reactions   Sulfa Antibiotics       Medication List      budesonide-formoterol 160-4.5 MCG/ACT inhaler Commonly known as:  SYMBICORT Inhale 2 puffs into the lungs 2 (two) times daily.   esomeprazole 40 MG capsule Commonly known as:  NEXIUM Take 1  capsule (40 mg total) by mouth daily.   Fluocinolone Acetonide 0.01 % Oil Place 1 drop in ear(s) daily as needed.   fluticasone 50 MCG/ACT nasal spray Commonly known as:  FLONASE Place 2 sprays into both nostrils daily.   furosemide 20 MG tablet Commonly known as:  LASIX   LINZESS 145 MCG Caps capsule Generic drug:  linaclotide as needed.   loratadine 10 MG tablet Commonly known as:  CLARITIN Take 10 mg by mouth daily as needed for allergies.   LUMIGAN 0.01 % Soln Generic drug:  bimatoprost   metoprolol 50 MG tablet Commonly known as:  LOPRESSOR   nitroGLYCERIN 0.4 MG SL tablet Commonly known as:  NITROSTAT Place 0.4 mg under the tongue.   olmesartan 40 MG tablet Commonly known as:  BENICAR Take 40 mg by mouth daily.   ONE-A-DAY WOMENS PO Take by mouth daily.   oxybutynin 5 MG tablet Commonly known as:  DITROPAN   potassium chloride SA 20 MEQ tablet Commonly known as:  K-DUR,KLOR-CON   PROAIR HFA 108 (90 Base) MCG/ACT inhaler Generic drug:  albuterol   SALINE MIST 0.65 % nasal spray Generic drug:  sodium chloride Place 1 spray into the nose as needed for congestion.   VITAMIN D PO Take by mouth daily.       Past Medical History:  Diagnosis Date  . Asthma   . COPD (chronic obstructive pulmonary disease) ([place])   . GERD (  gastroesophageal reflux disease)   . High blood pressure     Past Surgical History:  Procedure Laterality Date  . ABDOMINAL HYSTERECTOMY  1984  . HERNIA REPAIR  1984    Review of systems negative except as noted in HPI / PMHx or noted below:  Review of Systems  Constitutional: Negative.   HENT: Negative.   Eyes: Negative.   Respiratory: Negative.   Cardiovascular: Negative.   Gastrointestinal: Negative.   Genitourinary: Negative.   Musculoskeletal: Negative.   Skin: Negative.   Neurological: Negative.   Endo/Heme/Allergies: Negative.   Psychiatric/Behavioral: Negative.      Objective:   Vitals:   01/25/17 1343    BP: 140/74  Pulse: 60  Resp: 18          Physical Exam  Constitutional: She is well-developed, well-nourished, and in no distress.  HENT:  Head: Normocephalic.  Right Ear: Tympanic membrane, external ear and ear canal normal.  Left Ear: Tympanic membrane, external ear and ear canal normal.  Nose: Nose normal. No mucosal edema or rhinorrhea.  Mouth/Throat: Uvula is midline, oropharynx is clear and moist and mucous membranes are normal. No oropharyngeal exudate.  Eyes: Conjunctivae are normal.  Neck: Trachea normal. No tracheal tenderness present. No tracheal deviation present. No thyromegaly present.  Cardiovascular: Normal rate, regular rhythm, S1 normal, S2 normal and normal heart sounds.   No murmur heard. Pulmonary/Chest: Breath sounds normal. No stridor. No respiratory distress. She has no wheezes. She has no rales.  Musculoskeletal: She exhibits no edema.  Lymphadenopathy:       Head (right side): No tonsillar adenopathy present.       Head (left side): No tonsillar adenopathy present.    She has no cervical adenopathy.  Neurological: She is alert. Gait normal.  Skin: Rash (Circumferential hyperpigmented slightly lichenified slightly erythematous grouped lesions approximately 2 cm diameter left shin) noted. She is not diaphoretic. No erythema. Nails show no clubbing.  Psychiatric: Mood and affect normal.    Diagnostics:    Spirometry was performed and demonstrated an FEV1 of 0.65 at 44 % of predicted.  Assessment and Plan:   1. COPD with asthma ([place])   2. Other allergic rhinitis   3. LPRD (laryngopharyngeal reflux disease)   4. Inflammatory dermatosis     1. Continue Symbicort 160 - 2 inhalations one - two times a day depending on disease activity  2. Continue nasal fluticasone one spray each nostril one-2 times a day  3. Continue Nexium 40 mg in the morning  4. May add ranitidine 300 mg in the evening if needed  5. May add Claritin 10 mg once a day if  needed  6. May add Xopenex HFA or ProAir Respiclick 2 puffs every 4-6 hours if needed  7. May apply mometasone 0.1% cream to skin one time per day until resolved  8.  Return to clinic in 6 months or earlier if problem  Rachel Ho appears to be doing relatively well at this point in time and I'm going to have her continue to use Symbicort and nasal fluticasone to address the inflammatory component of her respiratory tract issue and have her continue to use Nexium and ranitidine if needed for her reflux. I've given her a topical steroid to use for her inflammatory dermatosis. She'll keep in contact with me noting her response. I'll see her back in this clinic in 6 months or earlier if there is a problem.  Allena Katz, MD Allergy / Immunology [facility]

## 2017-01-25 NOTE — Patient Instructions (Addendum)
  1. Continue Symbicort 160 - 2 inhalations one - two times a day depending on disease activity  2. Continue nasal fluticasone one spray each nostril one-2 times a day  3. Continue Nexium 40 mg in the morning  4. May add ranitidine 300 mg in the evening if needed  5. May add Claritin 10 mg once a day if needed  6. May add Xopenex HFA or ProAir Respiclick 2 puffs every 4-6 hours if needed  7. May apply mometasone 0.1% cream to skin one time per day until resolved  8.  Return to clinic in 6 months or earlier if problem

## 2017-02-10 DIAGNOSIS — Z1231 Encounter for screening mammogram for malignant neoplasm of breast: Secondary | ICD-10-CM | POA: Diagnosis not present

## 2017-02-20 DIAGNOSIS — Z6823 Body mass index (BMI) 23.0-23.9, adult: Secondary | ICD-10-CM | POA: Diagnosis not present

## 2017-02-20 DIAGNOSIS — I10 Essential (primary) hypertension: Secondary | ICD-10-CM | POA: Diagnosis not present

## 2017-02-20 DIAGNOSIS — Z79899 Other long term (current) drug therapy: Secondary | ICD-10-CM | POA: Diagnosis not present

## 2017-02-20 DIAGNOSIS — E782 Mixed hyperlipidemia: Secondary | ICD-10-CM | POA: Diagnosis not present

## 2017-02-20 DIAGNOSIS — R7309 Other abnormal glucose: Secondary | ICD-10-CM | POA: Diagnosis not present

## 2017-03-31 DIAGNOSIS — M21611 Bunion of right foot: Secondary | ICD-10-CM | POA: Diagnosis not present

## 2017-03-31 DIAGNOSIS — Z0000 Encounter for general adult medical examination without abnormal findings: Secondary | ICD-10-CM | POA: Diagnosis not present

## 2017-03-31 DIAGNOSIS — Z1389 Encounter for screening for other disorder: Secondary | ICD-10-CM | POA: Diagnosis not present

## 2017-03-31 DIAGNOSIS — Z9181 History of falling: Secondary | ICD-10-CM | POA: Diagnosis not present

## 2017-04-07 ENCOUNTER — Other Ambulatory Visit: Payer: Self-pay | Admitting: Allergy and Immunology

## 2017-04-21 DIAGNOSIS — H401212 Low-tension glaucoma, right eye, moderate stage: Secondary | ICD-10-CM | POA: Diagnosis not present

## 2017-04-21 DIAGNOSIS — H401222 Low-tension glaucoma, left eye, moderate stage: Secondary | ICD-10-CM | POA: Diagnosis not present

## 2017-06-01 DIAGNOSIS — H401212 Low-tension glaucoma, right eye, moderate stage: Secondary | ICD-10-CM | POA: Diagnosis not present

## 2017-06-01 DIAGNOSIS — H401222 Low-tension glaucoma, left eye, moderate stage: Secondary | ICD-10-CM | POA: Diagnosis not present

## 2017-06-01 DIAGNOSIS — H16142 Punctate keratitis, left eye: Secondary | ICD-10-CM | POA: Diagnosis not present

## 2017-06-28 DIAGNOSIS — L309 Dermatitis, unspecified: Secondary | ICD-10-CM | POA: Diagnosis not present

## 2017-06-28 DIAGNOSIS — Z6823 Body mass index (BMI) 23.0-23.9, adult: Secondary | ICD-10-CM | POA: Diagnosis not present

## 2017-08-02 DIAGNOSIS — I10 Essential (primary) hypertension: Secondary | ICD-10-CM | POA: Diagnosis not present

## 2017-08-02 DIAGNOSIS — E785 Hyperlipidemia, unspecified: Secondary | ICD-10-CM | POA: Diagnosis not present

## 2017-08-03 ENCOUNTER — Other Ambulatory Visit: Payer: Self-pay | Admitting: Allergy and Immunology

## 2017-09-12 DIAGNOSIS — Z23 Encounter for immunization: Secondary | ICD-10-CM | POA: Diagnosis not present

## 2017-09-20 ENCOUNTER — Other Ambulatory Visit: Payer: Self-pay | Admitting: Allergy and Immunology

## 2017-11-07 ENCOUNTER — Other Ambulatory Visit: Payer: Self-pay

## 2017-11-07 MED ORDER — PROAIR HFA 108 (90 BASE) MCG/ACT IN AERS: INHALATION_SPRAY | RESPIRATORY_TRACT | 0 refills | Status: DC

## 2017-12-04 DIAGNOSIS — I10 Essential (primary) hypertension: Secondary | ICD-10-CM | POA: Diagnosis not present

## 2017-12-04 DIAGNOSIS — R7309 Other abnormal glucose: Secondary | ICD-10-CM | POA: Diagnosis not present

## 2017-12-04 DIAGNOSIS — E782 Mixed hyperlipidemia: Secondary | ICD-10-CM | POA: Diagnosis not present

## 2017-12-04 DIAGNOSIS — Z1339 Encounter for screening examination for other mental health and behavioral disorders: Secondary | ICD-10-CM | POA: Diagnosis not present

## 2017-12-04 DIAGNOSIS — Z79899 Other long term (current) drug therapy: Secondary | ICD-10-CM | POA: Diagnosis not present

## 2017-12-21 DIAGNOSIS — H401212 Low-tension glaucoma, right eye, moderate stage: Secondary | ICD-10-CM | POA: Diagnosis not present

## 2017-12-21 DIAGNOSIS — H401222 Low-tension glaucoma, left eye, moderate stage: Secondary | ICD-10-CM | POA: Diagnosis not present

## 2018-01-31 DIAGNOSIS — J45909 Unspecified asthma, uncomplicated: Secondary | ICD-10-CM | POA: Diagnosis not present

## 2018-01-31 DIAGNOSIS — I10 Essential (primary) hypertension: Secondary | ICD-10-CM | POA: Diagnosis not present

## 2018-02-01 ENCOUNTER — Other Ambulatory Visit: Payer: Self-pay | Admitting: Allergy and Immunology

## 2018-04-06 DIAGNOSIS — Z1231 Encounter for screening mammogram for malignant neoplasm of breast: Secondary | ICD-10-CM | POA: Diagnosis not present

## 2018-04-10 DIAGNOSIS — H401222 Low-tension glaucoma, left eye, moderate stage: Secondary | ICD-10-CM | POA: Diagnosis not present

## 2018-04-10 DIAGNOSIS — H401212 Low-tension glaucoma, right eye, moderate stage: Secondary | ICD-10-CM | POA: Diagnosis not present

## 2018-04-11 DIAGNOSIS — Z0000 Encounter for general adult medical examination without abnormal findings: Secondary | ICD-10-CM | POA: Diagnosis not present

## 2018-04-11 DIAGNOSIS — E782 Mixed hyperlipidemia: Secondary | ICD-10-CM | POA: Diagnosis not present

## 2018-04-11 DIAGNOSIS — K5900 Constipation, unspecified: Secondary | ICD-10-CM | POA: Diagnosis not present

## 2018-04-11 DIAGNOSIS — Z79899 Other long term (current) drug therapy: Secondary | ICD-10-CM | POA: Diagnosis not present

## 2018-04-11 DIAGNOSIS — J45909 Unspecified asthma, uncomplicated: Secondary | ICD-10-CM | POA: Diagnosis not present

## 2018-07-11 DIAGNOSIS — H401222 Low-tension glaucoma, left eye, moderate stage: Secondary | ICD-10-CM | POA: Diagnosis not present

## 2018-07-11 DIAGNOSIS — H401212 Low-tension glaucoma, right eye, moderate stage: Secondary | ICD-10-CM | POA: Diagnosis not present

## 2018-07-31 ENCOUNTER — Telehealth: Payer: Self-pay | Admitting: Allergy and Immunology

## 2018-07-31 NOTE — Telephone Encounter (Signed)
Left a message for Rachel Ho to call the office. We have not sent a courtesy refill in the past but since it has been a year and a half since we have seen her, she will have to make and appt before we can send anything in. She refused to make an appt when she came into the office earlier.  If an appt is made, we can provide one refill for Proair only, but no other prescriptions can be given until she is seen.

## 2018-07-31 NOTE — Telephone Encounter (Signed)
Rachel Ho came in to the office requesting a prescription for PRO-AIR. I informed the patient she needed an appointment and she states she needs this today she can't breathe.  She stated she would ask her Primary care doctor if he will write her one, but asked if I could still get Dr. Raliegh Ip to write one for her.  I told her I will ask but she hasn't been seen in over a year and I couldn't promise anything.

## 2018-08-10 DIAGNOSIS — J45909 Unspecified asthma, uncomplicated: Secondary | ICD-10-CM | POA: Diagnosis not present

## 2018-08-10 DIAGNOSIS — I10 Essential (primary) hypertension: Secondary | ICD-10-CM | POA: Diagnosis not present

## 2018-09-06 DIAGNOSIS — M62838 Other muscle spasm: Secondary | ICD-10-CM | POA: Diagnosis not present

## 2018-09-06 DIAGNOSIS — Z6824 Body mass index (BMI) 24.0-24.9, adult: Secondary | ICD-10-CM | POA: Diagnosis not present

## 2018-09-06 DIAGNOSIS — Z23 Encounter for immunization: Secondary | ICD-10-CM | POA: Diagnosis not present

## 2019-01-03 DIAGNOSIS — J45909 Unspecified asthma, uncomplicated: Secondary | ICD-10-CM | POA: Diagnosis not present

## 2019-01-03 DIAGNOSIS — E782 Mixed hyperlipidemia: Secondary | ICD-10-CM | POA: Diagnosis not present

## 2019-01-03 DIAGNOSIS — Z79899 Other long term (current) drug therapy: Secondary | ICD-10-CM | POA: Diagnosis not present

## 2019-01-03 DIAGNOSIS — Z0000 Encounter for general adult medical examination without abnormal findings: Secondary | ICD-10-CM | POA: Diagnosis not present

## 2019-01-03 DIAGNOSIS — Z6823 Body mass index (BMI) 23.0-23.9, adult: Secondary | ICD-10-CM | POA: Diagnosis not present

## 2019-01-23 DIAGNOSIS — H401212 Low-tension glaucoma, right eye, moderate stage: Secondary | ICD-10-CM | POA: Diagnosis not present

## 2019-01-23 DIAGNOSIS — H401222 Low-tension glaucoma, left eye, moderate stage: Secondary | ICD-10-CM | POA: Diagnosis not present

## 2019-04-12 DIAGNOSIS — I10 Essential (primary) hypertension: Secondary | ICD-10-CM | POA: Diagnosis not present

## 2019-04-12 DIAGNOSIS — J45909 Unspecified asthma, uncomplicated: Secondary | ICD-10-CM | POA: Diagnosis not present

## 2019-05-21 DIAGNOSIS — Z1231 Encounter for screening mammogram for malignant neoplasm of breast: Secondary | ICD-10-CM | POA: Diagnosis not present

## 2019-05-24 DIAGNOSIS — H401232 Low-tension glaucoma, bilateral, moderate stage: Secondary | ICD-10-CM | POA: Diagnosis not present

## 2019-06-19 DIAGNOSIS — Z79899 Other long term (current) drug therapy: Secondary | ICD-10-CM | POA: Diagnosis not present

## 2019-06-19 DIAGNOSIS — G245 Blepharospasm: Secondary | ICD-10-CM | POA: Diagnosis not present

## 2019-06-19 DIAGNOSIS — E782 Mixed hyperlipidemia: Secondary | ICD-10-CM | POA: Diagnosis not present

## 2019-06-19 DIAGNOSIS — Z6823 Body mass index (BMI) 23.0-23.9, adult: Secondary | ICD-10-CM | POA: Diagnosis not present

## 2019-07-05 DIAGNOSIS — J45909 Unspecified asthma, uncomplicated: Secondary | ICD-10-CM | POA: Diagnosis not present

## 2019-07-05 DIAGNOSIS — K219 Gastro-esophageal reflux disease without esophagitis: Secondary | ICD-10-CM | POA: Diagnosis not present

## 2019-08-05 DIAGNOSIS — I10 Essential (primary) hypertension: Secondary | ICD-10-CM | POA: Diagnosis not present

## 2019-08-05 DIAGNOSIS — E785 Hyperlipidemia, unspecified: Secondary | ICD-10-CM | POA: Diagnosis not present

## 2019-08-11 DIAGNOSIS — K219 Gastro-esophageal reflux disease without esophagitis: Secondary | ICD-10-CM | POA: Diagnosis not present

## 2019-08-11 DIAGNOSIS — R42 Dizziness and giddiness: Secondary | ICD-10-CM | POA: Diagnosis not present

## 2019-08-11 DIAGNOSIS — J449 Chronic obstructive pulmonary disease, unspecified: Secondary | ICD-10-CM | POA: Diagnosis not present

## 2019-08-11 DIAGNOSIS — Z79899 Other long term (current) drug therapy: Secondary | ICD-10-CM | POA: Diagnosis not present

## 2019-08-27 DIAGNOSIS — Z23 Encounter for immunization: Secondary | ICD-10-CM | POA: Diagnosis not present

## 2019-08-27 DIAGNOSIS — Z6824 Body mass index (BMI) 24.0-24.9, adult: Secondary | ICD-10-CM | POA: Diagnosis not present

## 2019-08-27 DIAGNOSIS — J45909 Unspecified asthma, uncomplicated: Secondary | ICD-10-CM | POA: Diagnosis not present

## 2019-08-27 DIAGNOSIS — R42 Dizziness and giddiness: Secondary | ICD-10-CM | POA: Diagnosis not present

## 2019-08-29 DIAGNOSIS — Z79899 Other long term (current) drug therapy: Secondary | ICD-10-CM | POA: Diagnosis not present

## 2019-08-29 DIAGNOSIS — E782 Mixed hyperlipidemia: Secondary | ICD-10-CM | POA: Diagnosis not present

## 2019-08-29 DIAGNOSIS — M109 Gout, unspecified: Secondary | ICD-10-CM | POA: Diagnosis not present

## 2019-09-04 DIAGNOSIS — E782 Mixed hyperlipidemia: Secondary | ICD-10-CM | POA: Diagnosis not present

## 2019-09-04 DIAGNOSIS — I10 Essential (primary) hypertension: Secondary | ICD-10-CM | POA: Diagnosis not present

## 2019-10-04 DIAGNOSIS — I10 Essential (primary) hypertension: Secondary | ICD-10-CM | POA: Diagnosis not present

## 2019-10-04 DIAGNOSIS — E785 Hyperlipidemia, unspecified: Secondary | ICD-10-CM | POA: Diagnosis not present

## 2019-10-23 DIAGNOSIS — N179 Acute kidney failure, unspecified: Secondary | ICD-10-CM | POA: Diagnosis not present

## 2019-10-23 DIAGNOSIS — I313 Pericardial effusion (noninflammatory): Secondary | ICD-10-CM | POA: Diagnosis not present

## 2019-10-23 DIAGNOSIS — I714 Abdominal aortic aneurysm, without rupture: Secondary | ICD-10-CM | POA: Diagnosis not present

## 2019-10-23 DIAGNOSIS — I723 Aneurysm of iliac artery: Secondary | ICD-10-CM | POA: Diagnosis not present

## 2019-10-23 DIAGNOSIS — K921 Melena: Secondary | ICD-10-CM | POA: Diagnosis not present

## 2019-10-23 DIAGNOSIS — I959 Hypotension, unspecified: Secondary | ICD-10-CM | POA: Diagnosis not present

## 2019-10-23 DIAGNOSIS — D62 Acute posthemorrhagic anemia: Secondary | ICD-10-CM | POA: Diagnosis not present

## 2019-10-23 DIAGNOSIS — J9811 Atelectasis: Secondary | ICD-10-CM | POA: Diagnosis not present

## 2019-10-23 DIAGNOSIS — K625 Hemorrhage of anus and rectum: Secondary | ICD-10-CM | POA: Diagnosis not present

## 2019-10-24 DIAGNOSIS — Z79899 Other long term (current) drug therapy: Secondary | ICD-10-CM | POA: Diagnosis not present

## 2019-10-24 DIAGNOSIS — I959 Hypotension, unspecified: Secondary | ICD-10-CM | POA: Diagnosis not present

## 2019-10-24 DIAGNOSIS — I714 Abdominal aortic aneurysm, without rupture: Secondary | ICD-10-CM | POA: Diagnosis not present

## 2019-10-24 DIAGNOSIS — K635 Polyp of colon: Secondary | ICD-10-CM | POA: Diagnosis not present

## 2019-10-24 DIAGNOSIS — K5900 Constipation, unspecified: Secondary | ICD-10-CM | POA: Diagnosis not present

## 2019-10-24 DIAGNOSIS — D649 Anemia, unspecified: Secondary | ICD-10-CM | POA: Diagnosis not present

## 2019-10-24 DIAGNOSIS — Z8249 Family history of ischemic heart disease and other diseases of the circulatory system: Secondary | ICD-10-CM | POA: Diagnosis not present

## 2019-10-24 DIAGNOSIS — R933 Abnormal findings on diagnostic imaging of other parts of digestive tract: Secondary | ICD-10-CM | POA: Diagnosis not present

## 2019-10-24 DIAGNOSIS — E876 Hypokalemia: Secondary | ICD-10-CM | POA: Diagnosis not present

## 2019-10-24 DIAGNOSIS — K5731 Diverticulosis of large intestine without perforation or abscess with bleeding: Secondary | ICD-10-CM | POA: Diagnosis not present

## 2019-10-24 DIAGNOSIS — D62 Acute posthemorrhagic anemia: Secondary | ICD-10-CM | POA: Diagnosis not present

## 2019-10-24 DIAGNOSIS — I10 Essential (primary) hypertension: Secondary | ICD-10-CM | POA: Diagnosis not present

## 2019-10-24 DIAGNOSIS — K5730 Diverticulosis of large intestine without perforation or abscess without bleeding: Secondary | ICD-10-CM | POA: Diagnosis not present

## 2019-10-24 DIAGNOSIS — N179 Acute kidney failure, unspecified: Secondary | ICD-10-CM | POA: Diagnosis not present

## 2019-10-24 DIAGNOSIS — J9811 Atelectasis: Secondary | ICD-10-CM | POA: Diagnosis not present

## 2019-10-24 DIAGNOSIS — Z8719 Personal history of other diseases of the digestive system: Secondary | ICD-10-CM | POA: Diagnosis not present

## 2019-10-24 DIAGNOSIS — Z833 Family history of diabetes mellitus: Secondary | ICD-10-CM | POA: Diagnosis not present

## 2019-10-24 DIAGNOSIS — I313 Pericardial effusion (noninflammatory): Secondary | ICD-10-CM | POA: Diagnosis not present

## 2019-10-24 DIAGNOSIS — I723 Aneurysm of iliac artery: Secondary | ICD-10-CM | POA: Diagnosis not present

## 2019-10-24 DIAGNOSIS — K922 Gastrointestinal hemorrhage, unspecified: Secondary | ICD-10-CM | POA: Diagnosis not present

## 2019-10-24 DIAGNOSIS — D123 Benign neoplasm of transverse colon: Secondary | ICD-10-CM | POA: Diagnosis not present

## 2019-10-24 DIAGNOSIS — Z8261 Family history of arthritis: Secondary | ICD-10-CM | POA: Diagnosis not present

## 2019-10-24 DIAGNOSIS — Z87891 Personal history of nicotine dependence: Secondary | ICD-10-CM | POA: Diagnosis not present

## 2019-10-24 DIAGNOSIS — Z809 Family history of malignant neoplasm, unspecified: Secondary | ICD-10-CM | POA: Diagnosis not present

## 2019-10-24 DIAGNOSIS — K921 Melena: Secondary | ICD-10-CM | POA: Diagnosis not present

## 2019-10-24 DIAGNOSIS — K625 Hemorrhage of anus and rectum: Secondary | ICD-10-CM | POA: Diagnosis not present

## 2019-10-24 DIAGNOSIS — D500 Iron deficiency anemia secondary to blood loss (chronic): Secondary | ICD-10-CM | POA: Diagnosis not present

## 2019-10-24 DIAGNOSIS — K5790 Diverticulosis of intestine, part unspecified, without perforation or abscess without bleeding: Secondary | ICD-10-CM | POA: Diagnosis not present

## 2019-10-24 DIAGNOSIS — J45909 Unspecified asthma, uncomplicated: Secondary | ICD-10-CM | POA: Diagnosis not present

## 2019-11-04 DIAGNOSIS — I10 Essential (primary) hypertension: Secondary | ICD-10-CM | POA: Diagnosis not present

## 2019-11-04 DIAGNOSIS — I714 Abdominal aortic aneurysm, without rupture: Secondary | ICD-10-CM | POA: Diagnosis not present

## 2019-11-04 DIAGNOSIS — E782 Mixed hyperlipidemia: Secondary | ICD-10-CM | POA: Diagnosis not present

## 2019-11-08 DIAGNOSIS — I723 Aneurysm of iliac artery: Secondary | ICD-10-CM | POA: Diagnosis not present

## 2019-11-08 DIAGNOSIS — D62 Acute posthemorrhagic anemia: Secondary | ICD-10-CM | POA: Diagnosis not present

## 2019-11-08 DIAGNOSIS — R609 Edema, unspecified: Secondary | ICD-10-CM | POA: Diagnosis not present

## 2019-11-08 DIAGNOSIS — I714 Abdominal aortic aneurysm, without rupture: Secondary | ICD-10-CM | POA: Diagnosis not present

## 2019-11-13 DIAGNOSIS — Z6822 Body mass index (BMI) 22.0-22.9, adult: Secondary | ICD-10-CM | POA: Diagnosis not present

## 2019-11-13 DIAGNOSIS — I714 Abdominal aortic aneurysm, without rupture: Secondary | ICD-10-CM | POA: Diagnosis not present

## 2019-12-05 DIAGNOSIS — E785 Hyperlipidemia, unspecified: Secondary | ICD-10-CM | POA: Diagnosis not present

## 2019-12-05 DIAGNOSIS — I714 Abdominal aortic aneurysm, without rupture: Secondary | ICD-10-CM | POA: Diagnosis not present

## 2019-12-05 DIAGNOSIS — I10 Essential (primary) hypertension: Secondary | ICD-10-CM | POA: Diagnosis not present

## 2020-01-17 DIAGNOSIS — I10 Essential (primary) hypertension: Secondary | ICD-10-CM | POA: Diagnosis not present

## 2020-01-17 DIAGNOSIS — J45909 Unspecified asthma, uncomplicated: Secondary | ICD-10-CM | POA: Diagnosis not present

## 2020-03-02 DIAGNOSIS — Z1331 Encounter for screening for depression: Secondary | ICD-10-CM | POA: Diagnosis not present

## 2020-03-02 DIAGNOSIS — I10 Essential (primary) hypertension: Secondary | ICD-10-CM | POA: Diagnosis not present

## 2020-03-02 DIAGNOSIS — Z0000 Encounter for general adult medical examination without abnormal findings: Secondary | ICD-10-CM | POA: Diagnosis not present

## 2020-03-02 DIAGNOSIS — Z6822 Body mass index (BMI) 22.0-22.9, adult: Secondary | ICD-10-CM | POA: Diagnosis not present

## 2020-04-03 DIAGNOSIS — E7800 Pure hypercholesterolemia, unspecified: Secondary | ICD-10-CM | POA: Diagnosis not present

## 2020-04-03 DIAGNOSIS — I10 Essential (primary) hypertension: Secondary | ICD-10-CM | POA: Diagnosis not present

## 2020-05-04 DIAGNOSIS — I10 Essential (primary) hypertension: Secondary | ICD-10-CM | POA: Diagnosis not present

## 2020-05-04 DIAGNOSIS — K219 Gastro-esophageal reflux disease without esophagitis: Secondary | ICD-10-CM | POA: Diagnosis not present

## 2020-05-04 DIAGNOSIS — E7800 Pure hypercholesterolemia, unspecified: Secondary | ICD-10-CM | POA: Diagnosis not present

## 2020-05-22 DIAGNOSIS — E782 Mixed hyperlipidemia: Secondary | ICD-10-CM | POA: Diagnosis not present

## 2020-05-22 DIAGNOSIS — M109 Gout, unspecified: Secondary | ICD-10-CM | POA: Diagnosis not present

## 2020-05-22 DIAGNOSIS — I10 Essential (primary) hypertension: Secondary | ICD-10-CM | POA: Diagnosis not present

## 2020-05-22 DIAGNOSIS — Z6821 Body mass index (BMI) 21.0-21.9, adult: Secondary | ICD-10-CM | POA: Diagnosis not present

## 2020-05-22 DIAGNOSIS — Z79899 Other long term (current) drug therapy: Secondary | ICD-10-CM | POA: Diagnosis not present

## 2020-06-02 DIAGNOSIS — Z1231 Encounter for screening mammogram for malignant neoplasm of breast: Secondary | ICD-10-CM | POA: Diagnosis not present

## 2020-06-03 DIAGNOSIS — I10 Essential (primary) hypertension: Secondary | ICD-10-CM | POA: Diagnosis not present

## 2020-06-03 DIAGNOSIS — E7800 Pure hypercholesterolemia, unspecified: Secondary | ICD-10-CM | POA: Diagnosis not present

## 2020-06-03 DIAGNOSIS — K219 Gastro-esophageal reflux disease without esophagitis: Secondary | ICD-10-CM | POA: Diagnosis not present

## 2020-06-12 DIAGNOSIS — I714 Abdominal aortic aneurysm, without rupture: Secondary | ICD-10-CM | POA: Diagnosis not present

## 2020-06-12 DIAGNOSIS — I723 Aneurysm of iliac artery: Secondary | ICD-10-CM | POA: Diagnosis not present

## 2020-07-04 DIAGNOSIS — I10 Essential (primary) hypertension: Secondary | ICD-10-CM | POA: Diagnosis not present

## 2020-07-04 DIAGNOSIS — K219 Gastro-esophageal reflux disease without esophagitis: Secondary | ICD-10-CM | POA: Diagnosis not present

## 2020-07-04 DIAGNOSIS — E7800 Pure hypercholesterolemia, unspecified: Secondary | ICD-10-CM | POA: Diagnosis not present

## 2020-07-09 DIAGNOSIS — Z7951 Long term (current) use of inhaled steroids: Secondary | ICD-10-CM | POA: Diagnosis not present

## 2020-07-09 DIAGNOSIS — Z87891 Personal history of nicotine dependence: Secondary | ICD-10-CM | POA: Diagnosis not present

## 2020-07-09 DIAGNOSIS — I10 Essential (primary) hypertension: Secondary | ICD-10-CM | POA: Diagnosis not present

## 2020-07-09 DIAGNOSIS — J45909 Unspecified asthma, uncomplicated: Secondary | ICD-10-CM | POA: Diagnosis not present

## 2020-07-09 DIAGNOSIS — I714 Abdominal aortic aneurysm, without rupture: Secondary | ICD-10-CM | POA: Diagnosis not present

## 2020-07-09 DIAGNOSIS — I723 Aneurysm of iliac artery: Secondary | ICD-10-CM | POA: Diagnosis not present

## 2020-07-09 DIAGNOSIS — Z79899 Other long term (current) drug therapy: Secondary | ICD-10-CM | POA: Diagnosis not present

## 2020-08-04 DIAGNOSIS — I10 Essential (primary) hypertension: Secondary | ICD-10-CM | POA: Diagnosis not present

## 2020-08-04 DIAGNOSIS — K219 Gastro-esophageal reflux disease without esophagitis: Secondary | ICD-10-CM | POA: Diagnosis not present

## 2020-08-04 DIAGNOSIS — E7800 Pure hypercholesterolemia, unspecified: Secondary | ICD-10-CM | POA: Diagnosis not present

## 2020-08-06 DIAGNOSIS — Z48812 Encounter for surgical aftercare following surgery on the circulatory system: Secondary | ICD-10-CM | POA: Diagnosis not present

## 2020-08-06 DIAGNOSIS — I723 Aneurysm of iliac artery: Secondary | ICD-10-CM | POA: Diagnosis not present

## 2020-08-06 DIAGNOSIS — I714 Abdominal aortic aneurysm, without rupture: Secondary | ICD-10-CM | POA: Diagnosis not present

## 2020-09-10 DIAGNOSIS — R627 Adult failure to thrive: Secondary | ICD-10-CM | POA: Diagnosis not present

## 2020-09-10 DIAGNOSIS — R634 Abnormal weight loss: Secondary | ICD-10-CM | POA: Diagnosis not present

## 2020-09-10 DIAGNOSIS — E46 Unspecified protein-calorie malnutrition: Secondary | ICD-10-CM | POA: Diagnosis not present

## 2020-09-10 DIAGNOSIS — Z681 Body mass index (BMI) 19 or less, adult: Secondary | ICD-10-CM | POA: Diagnosis not present

## 2020-09-14 ENCOUNTER — Encounter (HOSPITAL_COMMUNITY): Payer: Self-pay | Admitting: Emergency Medicine

## 2020-09-14 ENCOUNTER — Inpatient Hospital Stay (HOSPITAL_COMMUNITY)
Admission: EM | Admit: 2020-09-14 | Discharge: 2020-09-21 | DRG: 683 | Disposition: A | Discharge status: Home Health | Payer: Medicare HMO | Attending: Internal Medicine | Admitting: Internal Medicine

## 2020-09-14 DIAGNOSIS — Z20822 Contact with and (suspected) exposure to covid-19: Secondary | ICD-10-CM | POA: Diagnosis not present

## 2020-09-14 DIAGNOSIS — Z8249 Family history of ischemic heart disease and other diseases of the circulatory system: Secondary | ICD-10-CM | POA: Diagnosis not present

## 2020-09-14 DIAGNOSIS — R531 Weakness: Secondary | ICD-10-CM

## 2020-09-14 DIAGNOSIS — Z7951 Long term (current) use of inhaled steroids: Secondary | ICD-10-CM

## 2020-09-14 DIAGNOSIS — R410 Disorientation, unspecified: Secondary | ICD-10-CM | POA: Diagnosis not present

## 2020-09-14 DIAGNOSIS — I4510 Unspecified right bundle-branch block: Secondary | ICD-10-CM | POA: Diagnosis present

## 2020-09-14 DIAGNOSIS — I10 Essential (primary) hypertension: Secondary | ICD-10-CM | POA: Diagnosis not present

## 2020-09-14 DIAGNOSIS — I129 Hypertensive chronic kidney disease with stage 1 through stage 4 chronic kidney disease, or unspecified chronic kidney disease: Secondary | ICD-10-CM | POA: Diagnosis present

## 2020-09-14 DIAGNOSIS — I714 Abdominal aortic aneurysm, without rupture: Secondary | ICD-10-CM | POA: Diagnosis present

## 2020-09-14 DIAGNOSIS — E039 Hypothyroidism, unspecified: Secondary | ICD-10-CM | POA: Diagnosis present

## 2020-09-14 DIAGNOSIS — N179 Acute kidney failure, unspecified: Secondary | ICD-10-CM | POA: Diagnosis not present

## 2020-09-14 DIAGNOSIS — N189 Chronic kidney disease, unspecified: Secondary | ICD-10-CM | POA: Diagnosis not present

## 2020-09-14 DIAGNOSIS — J449 Chronic obstructive pulmonary disease, unspecified: Secondary | ICD-10-CM | POA: Diagnosis not present

## 2020-09-14 DIAGNOSIS — Z681 Body mass index (BMI) 19 or less, adult: Secondary | ICD-10-CM

## 2020-09-14 DIAGNOSIS — R627 Adult failure to thrive: Secondary | ICD-10-CM | POA: Diagnosis present

## 2020-09-14 DIAGNOSIS — N19 Unspecified kidney failure: Secondary | ICD-10-CM

## 2020-09-14 DIAGNOSIS — K219 Gastro-esophageal reflux disease without esophagitis: Secondary | ICD-10-CM | POA: Diagnosis present

## 2020-09-14 DIAGNOSIS — Z882 Allergy status to sulfonamides status: Secondary | ICD-10-CM

## 2020-09-14 DIAGNOSIS — Z87891 Personal history of nicotine dependence: Secondary | ICD-10-CM | POA: Diagnosis not present

## 2020-09-14 DIAGNOSIS — R64 Cachexia: Secondary | ICD-10-CM | POA: Diagnosis present

## 2020-09-14 DIAGNOSIS — R54 Age-related physical debility: Secondary | ICD-10-CM | POA: Diagnosis present

## 2020-09-14 DIAGNOSIS — E46 Unspecified protein-calorie malnutrition: Secondary | ICD-10-CM | POA: Diagnosis present

## 2020-09-14 DIAGNOSIS — E861 Hypovolemia: Secondary | ICD-10-CM | POA: Diagnosis not present

## 2020-09-14 DIAGNOSIS — Z79899 Other long term (current) drug therapy: Secondary | ICD-10-CM | POA: Diagnosis not present

## 2020-09-14 DIAGNOSIS — E876 Hypokalemia: Secondary | ICD-10-CM | POA: Diagnosis not present

## 2020-09-14 LAB — URINALYSIS, ROUTINE W REFLEX MICROSCOPIC
Bilirubin Urine: NEGATIVE
Glucose, UA: NEGATIVE mg/dL
Ketones, ur: NEGATIVE mg/dL
Nitrite: NEGATIVE
Protein, ur: 30 mg/dL — AB
Specific Gravity, Urine: 1.011 (ref 1.005–1.030)
pH: 5 (ref 5.0–8.0)

## 2020-09-14 LAB — CBC
HCT: 37.9 % (ref 36.0–46.0)
Hemoglobin: 12.1 g/dL (ref 12.0–15.0)
MCH: 26.2 pg (ref 26.0–34.0)
MCHC: 31.9 g/dL (ref 30.0–36.0)
MCV: 82 fL (ref 80.0–100.0)
Platelets: 178 10*3/uL (ref 150–400)
RBC: 4.62 MIL/uL (ref 3.87–5.11)
RDW: 18.6 % — ABNORMAL HIGH (ref 11.5–15.5)
WBC: 7.7 10*3/uL (ref 4.0–10.5)
nRBC: 0.4 % — ABNORMAL HIGH (ref 0.0–0.2)

## 2020-09-14 LAB — BASIC METABOLIC PANEL
Anion gap: 15 (ref 5–15)
BUN: 116 mg/dL — ABNORMAL HIGH (ref 8–23)
CO2: 13 mmol/L — ABNORMAL LOW (ref 22–32)
Calcium: 12 mg/dL — ABNORMAL HIGH (ref 8.9–10.3)
Chloride: 109 mmol/L (ref 98–111)
Creatinine, Ser: 4.04 mg/dL — ABNORMAL HIGH (ref 0.44–1.00)
GFR, Estimated: 10 mL/min — ABNORMAL LOW (ref >60)
Glucose, Bld: 130 mg/dL — ABNORMAL HIGH (ref 70–99)
Potassium: 5 mmol/L (ref 3.5–5.1)
Sodium: 137 mmol/L (ref 135–145)

## 2020-09-14 NOTE — ED Triage Notes (Signed)
Pt arrives to triage room alone she states her son bought her here from her pcp office , there are no notes in chart and pt is not sure exactly what is going on, pt is very soft spoken and frail appearing. She states she is currently living at home with family and for over the last few months has been losing her strength and feeling very week. Pt states she just feels bad.

## 2020-09-15 ENCOUNTER — Encounter (HOSPITAL_COMMUNITY): Payer: Self-pay | Admitting: Internal Medicine

## 2020-09-15 ENCOUNTER — Other Ambulatory Visit: Payer: Self-pay

## 2020-09-15 ENCOUNTER — Emergency Department (HOSPITAL_COMMUNITY): Payer: Medicare HMO

## 2020-09-15 ENCOUNTER — Inpatient Hospital Stay (HOSPITAL_COMMUNITY): Payer: Medicare HMO

## 2020-09-15 DIAGNOSIS — E861 Hypovolemia: Secondary | ICD-10-CM | POA: Diagnosis not present

## 2020-09-15 DIAGNOSIS — Z882 Allergy status to sulfonamides status: Secondary | ICD-10-CM | POA: Diagnosis not present

## 2020-09-15 DIAGNOSIS — R54 Age-related physical debility: Secondary | ICD-10-CM | POA: Diagnosis present

## 2020-09-15 DIAGNOSIS — I714 Abdominal aortic aneurysm, without rupture: Secondary | ICD-10-CM | POA: Diagnosis present

## 2020-09-15 DIAGNOSIS — E46 Unspecified protein-calorie malnutrition: Secondary | ICD-10-CM | POA: Diagnosis present

## 2020-09-15 DIAGNOSIS — Z20822 Contact with and (suspected) exposure to covid-19: Secondary | ICD-10-CM | POA: Diagnosis present

## 2020-09-15 DIAGNOSIS — Z79899 Other long term (current) drug therapy: Secondary | ICD-10-CM | POA: Diagnosis not present

## 2020-09-15 DIAGNOSIS — I4510 Unspecified right bundle-branch block: Secondary | ICD-10-CM | POA: Diagnosis present

## 2020-09-15 DIAGNOSIS — I10 Essential (primary) hypertension: Secondary | ICD-10-CM | POA: Diagnosis not present

## 2020-09-15 DIAGNOSIS — R627 Adult failure to thrive: Secondary | ICD-10-CM | POA: Diagnosis present

## 2020-09-15 DIAGNOSIS — R531 Weakness: Secondary | ICD-10-CM | POA: Diagnosis not present

## 2020-09-15 DIAGNOSIS — Z87891 Personal history of nicotine dependence: Secondary | ICD-10-CM | POA: Diagnosis not present

## 2020-09-15 DIAGNOSIS — Z7951 Long term (current) use of inhaled steroids: Secondary | ICD-10-CM | POA: Diagnosis not present

## 2020-09-15 DIAGNOSIS — I129 Hypertensive chronic kidney disease with stage 1 through stage 4 chronic kidney disease, or unspecified chronic kidney disease: Secondary | ICD-10-CM | POA: Diagnosis present

## 2020-09-15 DIAGNOSIS — N179 Acute kidney failure, unspecified: Principal | ICD-10-CM | POA: Diagnosis present

## 2020-09-15 DIAGNOSIS — J449 Chronic obstructive pulmonary disease, unspecified: Secondary | ICD-10-CM | POA: Diagnosis present

## 2020-09-15 DIAGNOSIS — E876 Hypokalemia: Secondary | ICD-10-CM | POA: Diagnosis not present

## 2020-09-15 DIAGNOSIS — K219 Gastro-esophageal reflux disease without esophagitis: Secondary | ICD-10-CM | POA: Diagnosis present

## 2020-09-15 DIAGNOSIS — N189 Chronic kidney disease, unspecified: Secondary | ICD-10-CM | POA: Diagnosis present

## 2020-09-15 DIAGNOSIS — R64 Cachexia: Secondary | ICD-10-CM | POA: Diagnosis present

## 2020-09-15 DIAGNOSIS — Z8249 Family history of ischemic heart disease and other diseases of the circulatory system: Secondary | ICD-10-CM | POA: Diagnosis not present

## 2020-09-15 DIAGNOSIS — E039 Hypothyroidism, unspecified: Secondary | ICD-10-CM | POA: Diagnosis present

## 2020-09-15 DIAGNOSIS — Z681 Body mass index (BMI) 19 or less, adult: Secondary | ICD-10-CM | POA: Diagnosis not present

## 2020-09-15 HISTORY — DX: Acute kidney failure, unspecified: N17.9

## 2020-09-15 HISTORY — DX: Unspecified protein-calorie malnutrition: E46

## 2020-09-15 LAB — CBC
HCT: 39.8 % (ref 36.0–46.0)
Hemoglobin: 12.7 g/dL (ref 12.0–15.0)
MCH: 26.1 pg (ref 26.0–34.0)
MCHC: 31.9 g/dL (ref 30.0–36.0)
MCV: 81.9 fL (ref 80.0–100.0)
Platelets: 183 10*3/uL (ref 150–400)
RBC: 4.86 MIL/uL (ref 3.87–5.11)
RDW: 18.6 % — ABNORMAL HIGH (ref 11.5–15.5)
WBC: 6.9 10*3/uL (ref 4.0–10.5)
nRBC: 0 % (ref 0.0–0.2)

## 2020-09-15 LAB — BASIC METABOLIC PANEL
Anion gap: 10 (ref 5–15)
BUN: 112 mg/dL — ABNORMAL HIGH (ref 8–23)
CO2: 19 mmol/L — ABNORMAL LOW (ref 22–32)
Calcium: 11.4 mg/dL — ABNORMAL HIGH (ref 8.9–10.3)
Chloride: 113 mmol/L — ABNORMAL HIGH (ref 98–111)
Creatinine, Ser: 4.13 mg/dL — ABNORMAL HIGH (ref 0.44–1.00)
GFR, Estimated: 9 mL/min — ABNORMAL LOW (ref >60)
Glucose, Bld: 195 mg/dL — ABNORMAL HIGH (ref 70–99)
Potassium: 4.6 mmol/L (ref 3.5–5.1)
Sodium: 142 mmol/L (ref 135–145)

## 2020-09-15 LAB — CREATININE, SERUM
Creatinine, Ser: 4.14 mg/dL — ABNORMAL HIGH (ref 0.44–1.00)
GFR, Estimated: 9 mL/min — ABNORMAL LOW (ref >60)

## 2020-09-15 LAB — RESPIRATORY PANEL BY RT PCR (FLU A&B, COVID)
Influenza A by PCR: NEGATIVE
Influenza B by PCR: NEGATIVE
SARS Coronavirus 2 by RT PCR: NEGATIVE

## 2020-09-15 MED ORDER — NITROGLYCERIN 0.4 MG SL SUBL: 0.4000 mg | SUBLINGUAL_TABLET | SUBLINGUAL | Status: DC | PRN

## 2020-09-15 MED ORDER — METOPROLOL TARTRATE 50 MG PO TABS
50.0000 mg | ORAL_TABLET | Freq: Two times a day (BID) | ORAL | Status: DC
  Administered 2020-09-15 – 2020-09-21 (×11): 50 mg via ORAL
  Filled 2020-09-15 (×2): qty 1
  Filled 2020-09-15: qty 2
  Filled 2020-09-15 (×9): qty 1

## 2020-09-15 MED ORDER — HEPARIN SODIUM (PORCINE) 5000 UNIT/ML IJ SOLN
5000.0000 [IU] | Freq: Three times a day (TID) | INTRAMUSCULAR | Status: DC
  Administered 2020-09-15 – 2020-09-21 (×18): 5000 [IU] via SUBCUTANEOUS
  Filled 2020-09-15 (×18): qty 1

## 2020-09-15 MED ORDER — SODIUM CHLORIDE 0.9 % IV SOLN: Freq: Once | INTRAVENOUS | Status: AC

## 2020-09-15 MED ORDER — HYDRALAZINE HCL 20 MG/ML IJ SOLN: 10.0000 mg | Freq: Four times a day (QID) | INTRAMUSCULAR | Status: DC | PRN

## 2020-09-15 MED ORDER — ASPIRIN EC 81 MG PO TBEC
81.0000 mg | DELAYED_RELEASE_TABLET | Freq: Every day | ORAL | Status: DC
  Administered 2020-09-15 – 2020-09-21 (×7): 81 mg via ORAL
  Filled 2020-09-15 (×7): qty 1

## 2020-09-15 MED ORDER — ALBUTEROL SULFATE (2.5 MG/3ML) 0.083% IN NEBU
2.5000 mg | INHALATION_SOLUTION | Freq: Four times a day (QID) | RESPIRATORY_TRACT | Status: DC | PRN
  Administered 2020-09-19: 2.5 mg via RESPIRATORY_TRACT
  Filled 2020-09-15: qty 3

## 2020-09-15 MED ORDER — SODIUM CHLORIDE 0.9 % IV SOLN: INTRAVENOUS | Status: DC

## 2020-09-15 MED ORDER — PANTOPRAZOLE SODIUM 40 MG PO TBEC
40.0000 mg | DELAYED_RELEASE_TABLET | Freq: Every day | ORAL | Status: DC
  Administered 2020-09-15 – 2020-09-21 (×7): 40 mg via ORAL
  Filled 2020-09-15 (×7): qty 1

## 2020-09-15 NOTE — H&P (Signed)
History and Physical   Rachel Ho JYN:[id number] DOB: [birth year] DOA: 09/14/2020  PCP: Ronita Hipps, MD  Patient coming from: home  I have personally briefly reviewed patient's old medical records in [facility] EMR  Chief Concern: abnormal labs per PCP  HPI: Rachel Ho is a 85 y.o. female with medical history significant for asthma and hypertension presents per PCP for concerns of abnormal lab.   She endorses poor po intake since November 2020 due to her salt was taken away. She endorses baseline generalized weakness. She denies urinary/bowel changes.   She denies chest pain, shortness of breath, abdominal pain, diarrhea, constipation, nausea, vomiting, vision changes, headache, fever, chills, cough beyond baseline Am cough.   She states food still tastes good to her, only that since salt is taken way, food does not taste good anymore.   She lives at home by herself.  ED Course: Discussed with ED provider, AKI per PCP and sent to ED. Cr is 4.04 in ED, no baseline to compare. US renal has been ordered in ED.  Review of Systems: As per HPI otherwise 10 point review of systems negative.   Past Medical History:  Diagnosis Date  . Asthma   . COPD (chronic obstructive pulmonary disease) ([place])   . GERD (gastroesophageal reflux disease)   . High blood pressure    Past Surgical History:  Procedure Laterality Date  . ABDOMINAL HYSTERECTOMY  1984  . HERNIA REPAIR  1984   Social History:  reports that she quit smoking about 51 years ago. Her smoking use included cigarettes. She has a 50.00 pack-year smoking history. She has never used smokeless tobacco. She reports that she does not drink alcohol and does not use drugs.  Allergies  Allergen Reactions  . Sulfa Antibiotics    Family History  Problem Relation Age of Onset  . High blood pressure Mother    Family history: Family history reviewed and showed hypertension in mother  Prior to Admission medications     Medication Sig Start Date End Date Taking? Authorizing Provider  Cholecalciferol (VITAMIN D PO) Take by mouth daily.    [provider]  esomeprazole (NEXIUM) 40 MG capsule TAKE ONE CAPSULE BY MOUTH EVERY DAY 02/01/18   Kozlow, Donnamarie Poag, MD  Fluocinolone Acetonide 0.01 % OIL Place 1 drop in ear(s) daily as needed.    [provider]  fluticasone (FLONASE) 50 MCG/ACT nasal spray Place 2 sprays into both nostrils daily. 10/31/16   Kozlow, Donnamarie Poag, MD  furosemide (LASIX) 20 MG tablet  04/18/16   [provider]  LINZESS 145 MCG CAPS capsule as needed.  06/13/16   [provider]  loratadine (CLARITIN) 10 MG tablet Take 10 mg by mouth daily as needed for allergies.    [provider]  LUMIGAN 0.01 % SOLN  05/26/16   [provider]  metoprolol (LOPRESSOR) 50 MG tablet  05/03/16   [provider]  mometasone (ELOCON) 0.1 % cream Apply to affected areas once daily until resolved 01/25/17   Kozlow, Donnamarie Poag, MD  Multiple Vitamins-Calcium (ONE-A-DAY WOMENS PO) Take by mouth daily.    [provider]  nitroGLYCERIN (NITROSTAT) 0.4 MG SL tablet Place 0.4 mg under the tongue.    [provider]  olmesartan (BENICAR) 40 MG tablet Take 40 mg by mouth daily.  05/03/16   [provider]  oxybutynin (DITROPAN) 5 MG tablet  05/03/16   [provider]  potassium chloride SA (K-DUR,KLOR-CON) 20 MEQ tablet  07/14/16   [provider]  PROAIR HFA 108 (90 Base) MCG/ACT inhaler Inhale two puffs every four to six hours as needed for cough or wheeze. 11/07/17   Kozlow, Donnamarie Poag, MD  sodium chloride (SALINE MIST) 0.65 % nasal spray Place 1 spray into the nose as needed for congestion.    [provider]  SYMBICORT 160-4.5 MCG/ACT inhaler INHALE 2 PUFFS BY MOUTH 2 TIMES A DAY 04/07/17   Jiles Prows, MD   Physical Exam: Vitals:   09/15/20 0000 09/15/20 0005 09/15/20 0100 09/15/20 0239  BP: (!) 142/67 (!) 142/67 (!)  154/65 (!) 144/67  Pulse: (!) 45 63 (!) 59 63  Resp: 15 19 14 13   Temp:  (!) 97.2 F (36.2 C)    TempSrc:  Rectal    SpO2: 100% 100% 100% 97%  Weight:      Height:       Constitutional: frail appearing elderly female, does not appear to be in acute distress, calm; bilateral temporal and orbital wasting  Eyes: PERRL, lids and conjunctivae normal ENMT: Mucous membranes are moist. Posterior pharynx clear of any exudate or lesions.Normal dentition.  Neck: normal, supple, no masses, no thyromegaly Respiratory: clear to auscultation bilaterally, no wheezing, no crackles. Normal respiratory effort. No accessory muscle use.  Cardiovascular: Regular rate and rhythm, no murmurs / rubs / gallops. No extremity edema. 2+ pedal pulses. No carotid bruits.  Abdomen: no tenderness, no masses palpated. No hepatosplenomegaly. Bowel sounds positive.  Musculoskeletal: no clubbing / cyanosis. No joint deformity upper and lower extremities. Good ROM, no contractures. Frail with some muscle atrophy/wasting Skin: no rashes, lesions, ulcers. No induration Neurologic: CN 2-12 grossly intact. Sensation intact, DTR normal. Strength 5/5 in all 4.  Psychiatric: Normal judgment and insight. Alert and oriented x 3. Pleasant mood.   Labs on Admission: I have personally reviewed following labs and imaging studies  CBC: Recent Labs  Lab 09/14/20 1823 09/15/20 0118  WBC 7.7 6.9  HGB 12.1 12.7  HCT 37.9 39.8  MCV 82.0 81.9  PLT 178 678   Basic Metabolic Panel: Recent Labs  Lab 09/14/20 1823 09/15/20 0118  NA 137  --   K 5.0  --   CL 109  --   CO2 13*  --   GLUCOSE 130*  --   BUN 116*  --   CREATININE 4.04* 4.14*  CALCIUM 12.0*  --    Urine analysis:    Component Value Date/Time   COLORURINE YELLOW 09/14/2020 2241   APPEARANCEUR HAZY (A) 09/14/2020 2241   LABSPEC 1.011 09/14/2020 2241   PHURINE 5.0 09/14/2020 2241   GLUCOSEU NEGATIVE 09/14/2020 2241   HGBUR MODERATE (A) 09/14/2020 [place] 09/14/2020 [place] 09/14/2020 2241   PROTEINUR 30 (A) 09/14/2020 2241   NITRITE NEGATIVE 09/14/2020 2241   LEUKOCYTESUR SMALL (A) 09/14/2020 2241   Radiological Exams on Admission: Personally reviewed and I agree with radiologist reading as below.  US Renal  Result Date: 09/15/2020 CLINICAL DATA:  Acute renal injury EXAM: RENAL / URINARY TRACT ULTRASOUND COMPLETE COMPARISON:  CT abdomen pelvis 10/23/2019. FINDINGS: Right Kidney: Renal measurements: 9.4 x 3.4 x 5.1 = volume: 84 mL. Increased echogenicity with prominent medullary pyramids. There is a 1.5 cm cystic lesion within the right kidney that is grossly stable in size compared to prior CT 2020. No solid mass or hydronephrosis visualized. Left Kidney: Renal measurements: 8.5 x 4.4 x 4 = volume: 79 mL. Increased echogenicity with  prominent medullary pyramids. There is a 3.8 cm cystic lesion within the left kidney that is grossly similar slightly decreased in size compared to prior CT 2020. No solid mass or hydronephrosis visualized. Urinary bladder: Appears normal for degree of bladder distention. Other: None. IMPRESSION: 1. Bilateral echogenic parenchyma suggestive of chronic renal disease. 2. Otherwise grossly unremarkable renal ultrasound. Electronically Signed   By: Iven Finn M.D.   On: 09/15/2020 01:55    EKG: Independently reviewed, showing RBBB with rate of 61  Assessment/Plan  Ms. Clippinger is an 85 yo female with medical history of hypertension and asthma presents with chief concern of abnormal lab per her PCP and was advised to go to the ED for further evaluation.  Principal Problem:   AKI (acute kidney injury) ([place]) Active Problems:   Essential hypertension   Protein malnutrition ([place])   # AKI - etiology unclear at this time, suspect poor PO intake - She states that since her salt got taken away in November 2020, she has had very poor appetite - Baseline Cr/BUN is not available in  EMR - Suspect secondary to poor PO intake - NS IVF 100cc/hr ordered - US Renal ordered and read as bilateral echogenic parenchyma suggestive of chronic renal disease and otherwise grossly unremarkable renal US. - AM provider to consider calling nephrology in the AM  - BMP in the AM  # Hypertension - elevated - Holding home olmesartan 40 mg daily, furosemide 20 mg due to AKI - Resumed home metoprolol 50 mg BID - Hydralazine 10 mg q6h prn for SBP > 160   # Protein malnutrition - dietary consult placed - Heart healthy diet  # Asthma - home albuterol prn for wheezing and shortness of breath resumed inpatient  # GERD - pantoprazole 40 mg daily   DVT prophylaxis: heparin Code Status: FULL Diet: heart healthy Family Communication: discussed with family Disposition Plan: pending clinical course Consults called: nephrology in the AM Admission status: inpatient  Loraine Bhullar N Kimarion Chery D.O. [facility]  If 7AM-7PM, please contact day-coverage provider [email or web address]  09/15/2020, 3:09 AM

## 2020-09-15 NOTE — Progress Notes (Signed)
Patient ID: Rachel Ho, female   DOB: 27-Mar-1935, 85 y.o.   MRN: [id number] Patient admitted early this morning for abnormal labs and was found to have acute kidney injury and has been started on IV fluids.  Patient seen and examined at bedside and plan of care discussed with her.  I have reviewed patient's records including this morning's H&P, current vitals, labs and medications myself.  Labs from this morning are pending.  Continue IV fluids.  Repeat a.m. labs.  If renal function does not improve, will consider nephrology evaluation.

## 2020-09-15 NOTE — Progress Notes (Addendum)
Patient arrived from ED accompanied by daughter. Per report, pt is a/ox4, per assessment, she is only alert to self and place, daughter verbalized that she noted a "big change with mom since she saw her Thursday" the last time daughter seen the pt is mother's day this year. Daughter shares that patient does have people checking in on patient daily. Safety precaution reviewed with pt/daughter. TED hose and TELE applied. MD page r/t changes in LOC from previous assessment. Will continue to monitor.

## 2020-09-15 NOTE — Plan of Care (Signed)
Problem: Education: °Goal: Knowledge of General Education information will improve °Description: Including pain rating scale, medication(s)/side effects and non-pharmacologic comfort measures °Outcome: Completed/Met °  °

## 2020-09-15 NOTE — ED Provider Notes (Addendum)
[facility] EMERGENCY DEPARTMENT Provider Note   CSN: [id number] Arrival date & time: 09/14/20  1726     History Chief Complaint  Patient presents with  . Weakness  . Abnormal Lab    Rachel Ho is a 85 y.o. female.  HPI     This is an 85 year old female with a history of asthma, COPD, hypertension who presents with abnormal lab value.  Per patient's family, she went for lab testing at her primary care physician.  They have received a call to go to the emergency room because her creatinine and BUN were elevated.  They were being seen by the family doctor for generalized weakness and several month history of significant weight loss.  Family reports that she has lost approximately 25 pounds since June.  She does not receive her care in our system and I do not have access to her medical records but it appears that she was admitted to [facility] in August for AAA.  Patient is without physical complaints.  She states that she has felt generally weak but denies chest pain, shortness of breath, fevers, abdominal pain, nausea, vomiting.  She has continued to urinate.  She states "I take a pee pill."  Family reports that over the last 3 weeks they have noted just a general decline and "she has slowed down."  She lives at home with family.  Attempted to chart review.  Unable to see records from [facility].  I did review of cardiology note from [facility] that indicates history of hypertension but no known history of heart failure.  Past Medical History:  Diagnosis Date  . Asthma   . COPD (chronic obstructive pulmonary disease) ([place])   . GERD (gastroesophageal reflux disease)   . High blood pressure     Patient Active Problem List   Diagnosis Date Noted  . AKI (acute kidney injury) ([place]) 09/15/2020  . Obstructive lung disease (generalized) ([place]) 09/16/2015  . Allergic rhinitis 09/16/2015  . Nonallergic rhinitis 09/16/2015  . Laryngopharyngeal reflux  (LPR) 09/16/2015    Past Surgical History:  Procedure Laterality Date  . ABDOMINAL HYSTERECTOMY  1984  . HERNIA REPAIR  1984     OB History   No obstetric history on file.     Family History  Problem Relation Age of Onset  . High blood pressure Mother     Social History   Tobacco Use  . Smoking status: Former Smoker    Packs/day: 2.00    Years: 25.00    Pack years: 50.00    Types: Cigarettes    Quit date: 12/05/1968    Years since quitting: 51.8  . Smokeless tobacco: Never Used  Substance Use Topics  . Alcohol use: No    Alcohol/week: 0.0 standard drinks  . Drug use: No    Home Medications Prior to Admission medications   Medication Sig Start Date End Date Taking? Authorizing Provider  Cholecalciferol (VITAMIN D PO) Take by mouth daily.    [provider]  esomeprazole (NEXIUM) 40 MG capsule TAKE ONE CAPSULE BY MOUTH EVERY DAY 02/01/18   Kozlow, Donnamarie Poag, MD  Fluocinolone Acetonide 0.01 % OIL Place 1 drop in ear(s) daily as needed.    [provider]  fluticasone (FLONASE) 50 MCG/ACT nasal spray Place 2 sprays into both nostrils daily. 10/31/16   Kozlow, Donnamarie Poag, MD  furosemide (LASIX) 20 MG tablet  04/18/16   [provider]  LINZESS 145 MCG CAPS capsule as needed.  06/13/16   [provider]  loratadine (CLARITIN) 10 MG tablet Take 10 mg by mouth daily as needed for allergies.    [provider]  LUMIGAN 0.01 % SOLN  05/26/16   [provider]  metoprolol (LOPRESSOR) 50 MG tablet  05/03/16   [provider]  mometasone (ELOCON) 0.1 % cream Apply to affected areas once daily until resolved 01/25/17   Kozlow, Donnamarie Poag, MD  Multiple Vitamins-Calcium (ONE-A-DAY WOMENS PO) Take by mouth daily.    [provider]  nitroGLYCERIN (NITROSTAT) 0.4 MG SL tablet Place 0.4 mg under the tongue.    [provider]  olmesartan (BENICAR) 40 MG tablet Take 40 mg by mouth daily.  05/03/16   [provider]    oxybutynin (DITROPAN) 5 MG tablet  05/03/16   [provider]  potassium chloride SA (K-DUR,KLOR-CON) 20 MEQ tablet  07/14/16   [provider]  PROAIR HFA 108 (90 Base) MCG/ACT inhaler Inhale two puffs every four to six hours as needed for cough or wheeze. 11/07/17   Kozlow, Donnamarie Poag, MD  sodium chloride (SALINE MIST) 0.65 % nasal spray Place 1 spray into the nose as needed for congestion.    [provider]  SYMBICORT 160-4.5 MCG/ACT inhaler INHALE 2 PUFFS BY MOUTH 2 TIMES A DAY 04/07/17   Kozlow, Donnamarie Poag, MD    Allergies    Sulfa antibiotics  Review of Systems   Review of Systems  Constitutional: Positive for activity change, appetite change and unexpected weight change. Negative for fever.  Respiratory: Negative for cough and shortness of breath.   Cardiovascular: Negative for chest pain.  Gastrointestinal: Negative for abdominal pain, nausea and vomiting.  Genitourinary: Negative for difficulty urinating and dysuria.  All other systems reviewed and are negative.   Physical Exam Updated Vital Signs BP (!) 142/67   Pulse 63   Temp (!) 97.2 F (36.2 C) (Rectal)   Resp 19   Ht 1.651 m (5\' 5" )   Wt 54.4 kg   SpO2 100%   BMI 19.97 kg/m   Physical Exam Vitals and nursing note reviewed.  Constitutional:      Appearance: She is well-developed.     Comments: Elderly, chronically ill-appearing, cachectic  HENT:     Head: Normocephalic and atraumatic.     Mouth/Throat:     Mouth: Mucous membranes are dry.  Eyes:     Pupils: Pupils are equal, round, and reactive to light.  Cardiovascular:     Rate and Rhythm: Normal rate and regular rhythm.     Heart sounds: Normal heart sounds.  Pulmonary:     Effort: Pulmonary effort is normal. No respiratory distress.     Breath sounds: No wheezing.  Abdominal:     General: Bowel sounds are normal.     Palpations: Abdomen is soft.     Comments: Prior abdominal scar noted, no tenderness, rebound, guarding   Musculoskeletal:     Cervical back: Neck supple.     Right lower leg: No edema.     Left lower leg: No edema.  Skin:    General: Skin is warm and dry.  Neurological:     Mental Status: She is alert.     Comments: Oriented x2, symmetric strength in all 4 extremities  Psychiatric:        Mood and Affect: Mood normal.     ED Results / Procedures / Treatments   Labs (all labs ordered are listed, but only abnormal results  are displayed) Labs Reviewed  BASIC METABOLIC PANEL - Abnormal; Notable for the following components:      Result Value   CO2 13 (*)    Glucose, Bld 130 (*)    BUN 116 (*)    Creatinine, Ser 4.04 (*)    Calcium 12.0 (*)    GFR, Estimated 10 (*)    All other components within normal limits  CBC - Abnormal; Notable for the following components:   RDW 18.6 (*)    nRBC 0.4 (*)    All other components within normal limits  URINALYSIS, ROUTINE W REFLEX MICROSCOPIC - Abnormal; Notable for the following components:   APPearance HAZY (*)    Hgb urine dipstick MODERATE (*)    Protein, ur 30 (*)    Leukocytes,Ua SMALL (*)    Bacteria, UA RARE (*)    All other components within normal limits  RESPIRATORY PANEL BY RT PCR (FLU A&B, COVID)  SODIUM, URINE, RANDOM  CREATININE, URINE, RANDOM  CBG MONITORING, ED    EKG EKG Interpretation  Date/Time:  Tuesday September 15 2020 00:31:45 EDT Ventricular Rate:  61 PR Interval:    QRS Duration: 159 QT Interval:  439 QTC Calculation: 443 R Axis:   109 Text Interpretation: Sinus or ectopic atrial rhythm Short PR interval Right bundle branch block No old tracing to compare Confirmed by Delora Fuel ([id number]) on 09/15/2020 12:36:09 AM   Radiology No results found.  Procedures Procedures (including critical care time)  Medications Ordered in ED Medications  0.9 %  sodium chloride infusion (has no administration in time range)    ED Course  I have reviewed the triage vital signs and the nursing notes.  Pertinent  labs & imaging results that were available during my care of the patient were reviewed by me and considered in my medical decision making (see chart for details).    MDM Rules/Calculators/A&P                           Patient presents with generalized weakness.  Noted to have abnormal lab values with AKI from her primary physician.  She is overall nontoxic-appearing.  Vital signs are largely reassuring.  She is afebrile.  She has no specific complaints except for generalized weakness and weight loss since June.  She does appear cachectic and dry.  I do not know what her baseline creatinine is but it is 4.04 today.  Potassium is 5.0.  Have ordered an EKG.  BUN is 116.  She is only oriented x2 but does not floridly encephalopathic.  Suspect gradual worsening creatinine given compensated potassium.  She is on Lasix 20 mg daily.  That will need to be held.  She was started on normal saline drip.  Additional lab work including urine sodium, urine creatinine, and renal ultrasound were ordered.  Covid testing ordered.  Patient will need admission for further evaluation for her AKI and nephrology consultation.  Patient has no indication for emergent dialysis at this time.  Urinalysis shows no evidence of UTI.  EKG shows no evidence of acute arrhythmia.  However, no prior for comparison and she does have a bundle branch block.  She will be admitted to the hospitalist.  Final Clinical Impression(s) / ED Diagnoses Final diagnoses:  AKI (acute kidney injury) ([place])  Uremia  Generalized weakness    Rx / DC Orders ED Discharge Orders    None       Keisa Blow, Barbette Hair,  MD 09/15/20 0111    Merryl Hacker, MD 09/15/20 0111

## 2020-09-16 DIAGNOSIS — N179 Acute kidney failure, unspecified: Secondary | ICD-10-CM | POA: Diagnosis not present

## 2020-09-16 DIAGNOSIS — E46 Unspecified protein-calorie malnutrition: Secondary | ICD-10-CM | POA: Diagnosis not present

## 2020-09-16 DIAGNOSIS — I10 Essential (primary) hypertension: Secondary | ICD-10-CM | POA: Diagnosis not present

## 2020-09-16 LAB — CBC WITH DIFFERENTIAL/PLATELET
Abs Immature Granulocytes: 0.01 10*3/uL (ref 0.00–0.07)
Basophils Absolute: 0 10*3/uL (ref 0.0–0.1)
Basophils Relative: 0 %
Eosinophils Absolute: 0.1 10*3/uL (ref 0.0–0.5)
Eosinophils Relative: 1 %
HCT: 33.2 % — ABNORMAL LOW (ref 36.0–46.0)
Hemoglobin: 10.6 g/dL — ABNORMAL LOW (ref 12.0–15.0)
Immature Granulocytes: 0 %
Lymphocytes Relative: 21 %
Lymphs Abs: 1.2 10*3/uL (ref 0.7–4.0)
MCH: 26.7 pg (ref 26.0–34.0)
MCHC: 31.9 g/dL (ref 30.0–36.0)
MCV: 83.6 fL (ref 80.0–100.0)
Monocytes Absolute: 0.6 10*3/uL (ref 0.1–1.0)
Monocytes Relative: 10 %
Neutro Abs: 3.9 10*3/uL (ref 1.7–7.7)
Neutrophils Relative %: 68 %
Platelets: 144 10*3/uL — ABNORMAL LOW (ref 150–400)
RBC: 3.97 MIL/uL (ref 3.87–5.11)
RDW: 18.9 % — ABNORMAL HIGH (ref 11.5–15.5)
WBC: 5.8 10*3/uL (ref 4.0–10.5)
nRBC: 0 % (ref 0.0–0.2)

## 2020-09-16 LAB — BASIC METABOLIC PANEL
Anion gap: 9 (ref 5–15)
BUN: 87 mg/dL — ABNORMAL HIGH (ref 8–23)
CO2: 17 mmol/L — ABNORMAL LOW (ref 22–32)
Calcium: 10 mg/dL (ref 8.9–10.3)
Chloride: 115 mmol/L — ABNORMAL HIGH (ref 98–111)
Creatinine, Ser: 3.36 mg/dL — ABNORMAL HIGH (ref 0.44–1.00)
GFR, Estimated: 12 mL/min — ABNORMAL LOW (ref >60)
Glucose, Bld: 107 mg/dL — ABNORMAL HIGH (ref 70–99)
Potassium: 3.7 mmol/L (ref 3.5–5.1)
Sodium: 141 mmol/L (ref 135–145)

## 2020-09-16 LAB — AMMONIA: Ammonia: 32 umol/L (ref 9–35)

## 2020-09-16 LAB — VITAMIN B12: Vitamin B-12: 6473 pg/mL — ABNORMAL HIGH (ref 180–914)

## 2020-09-16 LAB — MAGNESIUM: Magnesium: 2.3 mg/dL (ref 1.7–2.4)

## 2020-09-16 LAB — TSH: TSH: 232.638 u[IU]/mL — ABNORMAL HIGH (ref 0.350–4.500)

## 2020-09-16 LAB — FOLATE: Folate: 22.8 ng/mL (ref >5.9)

## 2020-09-16 LAB — PHOSPHORUS: Phosphorus: 2.4 mg/dL — ABNORMAL LOW (ref 2.5–4.6)

## 2020-09-16 MED ORDER — ONDANSETRON HCL 4 MG/2ML IJ SOLN: 4.0000 mg | Freq: Four times a day (QID) | INTRAMUSCULAR | Status: DC | PRN

## 2020-09-16 MED ORDER — LACTATED RINGERS IV SOLN: INTRAVENOUS | Status: DC

## 2020-09-16 MED ORDER — ENSURE ENLIVE PO LIQD
237.0000 mL | Freq: Two times a day (BID) | ORAL | Status: DC
  Administered 2020-09-17 – 2020-09-21 (×10): 237 mL via ORAL

## 2020-09-16 NOTE — Evaluation (Signed)
Physical Therapy Evaluation Patient Details Name: Rachel Ho MRN: [id number] DOB: 1935/05/02 Today's Date: 09/16/2020   History of Present Illness   pt is 85 y.o. female with a past medical history significant for asthma, COPD, HTN, and recent AAA (August 2021) admitted at the urging of her PCP for elevated BUN/C. Pt states she has had poor PO intake and increased weakness over the last few months  Clinical Impression  PTA pt was I with gross motor tasks without use of AD; pt states family checks in on her to assist with cooking and cleaning; pt states she will have someone to assist 24/7 upon discharge; pt requiring increased time to process commands and increased assist for functional mobility tasks at this time. Pt demonstrating deficits in strength, coordination, gait, safety and endurance and will benefit from skilled PT to address deficits to maximize independence with functional mobility prior to discharge.     Follow Up Recommendations Home health PT vs SNF (Pending progress with therapy);Supervision/Assistance - 24 hour    Equipment Recommendations  Rolling walker with 5" wheels;3in1 (PT)    Recommendations for Other Services OT consult     Precautions / Restrictions Precautions Precautions: Fall Restrictions Weight Bearing Restrictions: No      Mobility  Bed Mobility Overal bed mobility: Needs Assistance Bed Mobility: Supine to Sit;Sit to Supine     Supine to sit: Mod assist Sit to supine: Min guard   General bed mobility comments: pt requiring use of B UEs to assist with B LEs for sit>supine; pt attempted to slide off bed for supine>sit and pt unable to bring trunk into sitting position; education on log roll, need to follow up  Transfers Overall transfer level: Needs assistance Equipment used: None Transfers: Stand Pivot Transfers;Sit to/from Stand Sit to Stand: Mod assist Stand pivot transfers: Mod assist       General transfer comment: performed  sit<>stand from EOB with mod A needed due to increased posterior lean, pt then requesting to transfer to [facility], mod A needed without use of AD  Ambulation/Gait Ambulation/Gait assistance: Min assist   Assistive device: Rolling walker (2 wheeled)       General Gait Details: pt wtih poor awareness of positioning/alignment in RW; pt with increased forwar flexion, wide BOS, shuffling gait pattern and extreme decreased velocity; assist needed for RW steering and management  Stairs            Wheelchair Mobility    Modified Rankin (Stroke Patients Only)       Balance Overall balance assessment: Needs assistance Sitting-balance support: Feet supported Sitting balance-Leahy Scale: Poor   Postural control: Posterior lean Standing balance support: No upper extremity supported Standing balance-Leahy Scale: Poor                               Pertinent Vitals/Pain Pain Assessment: No/denies pain    Home Living Family/patient expects to be discharged to:: Private residence Living Arrangements: Alone Available Help at Discharge: Family Type of Home: House Home Access: Level entry     Home Layout: One level Home Equipment: Shower seat;Hand held shower head      Prior Function Level of Independence: Independent         Comments: pt states her siblings and children check on her regularly; she has family who helps wtih cooking and cleaning; she states she still drives and is able to have someone assist her 24/7 upon discharge.  Hand Dominance   Dominant Hand: Right    Extremity/Trunk Assessment   Upper Extremity Assessment Upper Extremity Assessment: Overall WFL for tasks assessed    Lower Extremity Assessment Lower Extremity Assessment: Generalized weakness    Cervical / Trunk Assessment Cervical / Trunk Assessment: Kyphotic  Communication   Communication: No difficulties  Cognition Arousal/Alertness: Awake/alert Behavior During Therapy: WFL  for tasks assessed/performed Overall Cognitive Status: Within Functional Limits for tasks assessed                                 General Comments: pt requiring increased time to process and answer questions      General Comments      Exercises     Assessment/Plan    PT Assessment Patient needs continued PT services  PT Problem List Decreased strength;Decreased mobility;Decreased safety awareness;Decreased coordination;Decreased activity tolerance;Decreased cognition;Decreased balance;Decreased knowledge of use of DME       PT Treatment Interventions DME instruction;Therapeutic exercise;Gait training;Balance training;Functional mobility training;Therapeutic activities;Patient/family education    PT Goals (Current goals can be found in the Care Plan section)  Acute Rehab PT Goals Patient Stated Goal: I want to go home PT Goal Formulation: With patient Time For Goal Achievement: 09/30/20 Potential to Achieve Goals: Fair    Frequency Min 3X/week   Barriers to discharge        Co-evaluation               AM-PAC PT "6 Clicks" Mobility  Outcome Measure Help needed turning from your back to your side while in a flat bed without using bedrails?: A Little Help needed moving from lying on your back to sitting on the side of a flat bed without using bedrails?: A Lot Help needed moving to and from a bed to a chair (including a wheelchair)?: A Lot Help needed standing up from a chair using your arms (e.g., wheelchair or bedside chair)?: A Lot Help needed to walk in hospital room?: A Lot Help needed climbing 3-5 steps with a railing? : Total 6 Click Score: 12    End of Session Equipment Utilized During Treatment: Gait belt Activity Tolerance: Patient tolerated treatment well Patient left: in bed;with call bell/phone within reach;with chair alarm set Nurse Communication: Mobility status PT Visit Diagnosis: Unsteadiness on feet (R26.81);Other abnormalities of  gait and mobility (R26.89);Muscle weakness (generalized) (M62.81)    Time: 0092-3300 PT Time Calculation (min) (ACUTE ONLY): 29 min   Charges:   PT Evaluation $PT Eval Low Complexity: 1 Low PT Treatments $Therapeutic Activity: 8-22 mins        Lyanne Co, DPT Acute Rehabilitation Services [phone]  [place] 09/16/2020, 2:28 PM

## 2020-09-16 NOTE — Progress Notes (Signed)
PROGRESS NOTE    Rachel Ho  HWE:[id number] DOB: Apr 16, 1935 DOA: 09/14/2020 PCP: Ronita Hipps, MD   Brief Narrative:  HPI On 09/15/2020 by Dr. Rupert Stacks Rachel Ho is a 85 y.o. female with medical history significant for asthma and hypertension presents per PCP for concerns of abnormal lab.   She endorses poor po intake since November 2020 due to her salt was taken away. She endorses baseline generalized weakness. She denies urinary/bowel changes.   She denies chest pain, shortness of breath, abdominal pain, diarrhea, constipation, nausea, vomiting, vision changes, headache, fever, chills, cough beyond baseline Am cough.   She states food still tastes good to her, only that since salt is taken way, food does not taste good anymore.   She lives at home by herself.  Interim history Patient found to have AKI and placed on IV fluids.  Nephrology consulted and appreciated Assessment & Plan   Acute kidney injury -Unclear etiology, possibly secondary to poor oral intake -Patient's creatinine level was approximately 1 back in June 2021 -Patient stated that since her salt intake has been reduced, she has had very poor appetite -Patient currently on IV fluids -Renal ultrasound showed bilateral echogenic parenchyma suggestive of chronic renal disease and otherwise grossly unremarkable renal ultrasound  -Nephrology consulted and appreciated -Creatinine on admission was 4.04 however increased to 4.14.  Today down to 3.36 -Continue to monitor BMP  Essential hypertension -Home medications of olmesartan, Lasix held -Continue metoprolol as well as hydralazine  Malnutrition -Dietitian consulted  Asthma -Currently no wheezing, continue albuterol as needed  GERD -Continue PPI  DVT Prophylaxis Heparin  Code Status: Full  Family Communication: None at bedside  Disposition Plan:  Status is: Inpatient  Remains inpatient appropriate because:IV treatments appropriate due to  intensity of illness or inability to take PO and Inpatient level of care appropriate due to severity of illness   Dispo: The patient is from: Home              Anticipated d/c is to: TBD              Anticipated d/c date is: > 3 days              Patient currently is not medically stable to d/c.  Consultants Nephrology   Procedures  Renal US  Antibiotics   Anti-infectives (From admission, onward)   None      Subjective:   Rachel Ho seen and examined today.  Patient states she is feeling better today.  Denies current chest pain, shortness of breath, abdominal pain, nausea or vomiting, diarrhea or constipation, dizziness or headache.    Objective:   Vitals:   09/16/20 0029 09/16/20 0540 09/16/20 1216 09/16/20 1419  BP: (!) 150/61 (!) 166/65 137/60 (!) 123/50  Pulse: (!) 51 (!) 52 (!) 59 (!) 57  Resp: 16 18 16    Temp: 97.8 F (36.6 C) 97.6 F (36.4 C) (!) 97.4 F (36.3 C)   TempSrc: Oral Oral Oral   SpO2: 99% 99% 100% 100%  Weight:  45.6 kg    Height:        Intake/Output Summary (Last 24 hours) at 09/16/2020 1613 Last data filed at 09/16/2020 1330 Gross per 24 hour  Intake 1347.62 ml  Output 600 ml  Net 747.62 ml   Filed Weights   09/14/20 1812 09/16/20 0540  Weight: 54.4 kg 45.6 kg    Exam  General: Well developed, thin, frail, elderly, NAD  HEENT: NCAT, mucous membranes moist.  Cardiovascular: S1 S2 auscultated, RRR  Respiratory: Clear to auscultation bilaterally   Abdomen: Soft, nontender, nondistended, + bowel sounds  Extremities: warm dry without cyanosis clubbing or edema  Neuro: AAOx3, nonfocal  Psych: appropriate mood and affect   Data Reviewed: I have personally reviewed following labs and imaging studies  CBC: Recent Labs  Lab 09/14/20 1823 09/15/20 0118 09/16/20 0822  WBC 7.7 6.9 5.8  NEUTROABS  --   --  3.9  HGB 12.1 12.7 10.6*  HCT 37.9 39.8 33.2*  MCV 82.0 81.9 83.6  PLT 178 183 998*   Basic Metabolic  Panel: Recent Labs  Lab 09/14/20 1823 09/15/20 0118 09/15/20 0819 09/16/20 0822 09/16/20 1420  NA 137  --  142 141  --   K 5.0  --  4.6 3.7  --   CL 109  --  113* 115*  --   CO2 13*  --  19* 17*  --   GLUCOSE 130*  --  195* 107*  --   BUN 116*  --  112* 87*  --   CREATININE 4.04* 4.14* 4.13* 3.36*  --   CALCIUM 12.0*  --  11.4* 10.0  --   MG  --   --   --  2.3  --   PHOS  --   --   --   --  2.4*   GFR: Estimated Creatinine Clearance: 9.1 mL/min (A) (by C-G formula based on SCr of 3.36 mg/dL (H)). Liver Function Tests: No results for input(s): AST, ALT, ALKPHOS, BILITOT, PROT, ALBUMIN in the last 168 hours. No results for input(s): LIPASE, AMYLASE in the last 168 hours. Recent Labs  Lab 09/16/20 0822  AMMONIA 32   Coagulation Profile: No results for input(s): INR, PROTIME in the last 168 hours. Cardiac Enzymes: No results for input(s): CKTOTAL, CKMB, CKMBINDEX, TROPONINI in the last 168 hours. BNP (last 3 results) No results for input(s): PROBNP in the last 8760 hours. HbA1C: No results for input(s): HGBA1C in the last 72 hours. CBG: No results for input(s): GLUCAP in the last 168 hours. Lipid Profile: No results for input(s): CHOL, HDL, LDLCALC, TRIG, CHOLHDL, LDLDIRECT in the last 72 hours. Thyroid Function Tests: Recent Labs    09/16/20 0822  TSH 232.638*   Anemia Panel: Recent Labs    09/16/20 0822  VITAMINB12 6,473*  FOLATE 22.8   Urine analysis:    Component Value Date/Time   COLORURINE YELLOW 09/14/2020 2241   APPEARANCEUR HAZY (A) 09/14/2020 2241   LABSPEC 1.011 09/14/2020 2241   PHURINE 5.0 09/14/2020 2241   GLUCOSEU NEGATIVE 09/14/2020 2241   HGBUR MODERATE (A) 09/14/2020 2241   BILIRUBINUR NEGATIVE 09/14/2020 2241   [place] 09/14/2020 2241   PROTEINUR 30 (A) 09/14/2020 2241   NITRITE NEGATIVE 09/14/2020 2241   LEUKOCYTESUR SMALL (A) 09/14/2020 2241   Sepsis Labs: @LABRCNTIP (procalcitonin:4,lacticidven:4)  ) Recent Results  (from the past 240 hour(s))  Respiratory Panel by RT PCR (Flu A&B, Covid) - Nasopharyngeal Swab     Status: None   Collection Time: 09/15/20  2:36 AM   Specimen: Nasopharyngeal Swab  Result Value Ref Range Status   SARS Coronavirus 2 by RT PCR NEGATIVE NEGATIVE Final    Comment: (NOTE) SARS-CoV-2 target nucleic acids are NOT DETECTED.  The SARS-CoV-2 RNA is generally detectable in upper respiratoy specimens during the acute phase of infection. The lowest concentration of SARS-CoV-2 viral copies this assay can detect is 131 copies/mL. A negative result does not preclude SARS-Cov-2 infection and should  not be used as the sole basis for treatment or other patient management decisions. A negative result may occur with  improper specimen collection/handling, submission of specimen other than nasopharyngeal swab, presence of viral mutation(s) within the areas targeted by this assay, and inadequate number of viral copies (<131 copies/mL). A negative result must be combined with clinical observations, patient history, and epidemiological information. The expected result is Negative.  Fact Sheet for Patients:  PinkCheek.be  Fact Sheet for Healthcare Providers:  GravelBags.it  This test is no t yet approved or cleared by the [place] FDA and  has been authorized for detection and/or diagnosis of SARS-CoV-2 by FDA under an Emergency Use Authorization (EUA). This EUA will remain  in effect (meaning this test can be used) for the duration of the COVID-19 declaration under Section 564(b)(1) of the Act, 21 U.S.C. section 360bbb-3(b)(1), unless the authorization is terminated or revoked sooner.     Influenza A by PCR NEGATIVE NEGATIVE Final   Influenza B by PCR NEGATIVE NEGATIVE Final    Comment: (NOTE) The Xpert Xpress SARS-CoV-2/FLU/RSV assay is intended as an aid in  the diagnosis of influenza from Nasopharyngeal swab specimens  and  should not be used as a sole basis for treatment. Nasal washings and  aspirates are unacceptable for Xpert Xpress SARS-CoV-2/FLU/RSV  testing.  Fact Sheet for Patients: PinkCheek.be  Fact Sheet for Healthcare Providers: GravelBags.it  This test is not yet approved or cleared by the [place] FDA and  has been authorized for detection and/or diagnosis of SARS-CoV-2 by  FDA under an Emergency Use Authorization (EUA). This EUA will remain  in effect (meaning this test can be used) for the duration of the  Covid-19 declaration under Section 564(b)(1) of the Act, 21  U.S.C. section 360bbb-3(b)(1), unless the authorization is  terminated or revoked. Performed at [facility], [place] [address] [place], [place] [address]       Radiology Studies: CT HEAD WO CONTRAST  Result Date: 09/15/2020 CLINICAL DATA:  85 year old female with delirium. EXAM: CT HEAD WITHOUT CONTRAST TECHNIQUE: Contiguous axial images were obtained from the base of the skull through the vertex without intravenous contrast. COMPARISON:  [facility] head CT 08/11/2019. FINDINGS: Brain: No midline shift, ventriculomegaly, mass effect, evidence of mass lesion, intracranial hemorrhage or evidence of cortically based acute infarction. Cavum vergae, normal variant. Mild for age scattered white matter hypodensity. Otherwise normal gray-white matter differentiation. No cortical encephalomalacia identified. Vascular: Mild Calcified atherosclerosis at the skull base. No suspicious intracranial vascular hyperdensity. Skull: No acute osseous abnormality identified. Sinuses/Orbits: Visualized paranasal sinuses and mastoids are stable and well pneumatized. Other: No acute orbit or scalp soft tissue findings. IMPRESSION: 1. No acute intracranial abnormality. 2. Mild for age cerebral white matter changes, most commonly due to small vessel disease. Electronically Signed    By: Genevie Ann M.D.   On: 09/15/2020 15:07   US Renal  Result Date: 09/15/2020 CLINICAL DATA:  Acute renal injury EXAM: RENAL / URINARY TRACT ULTRASOUND COMPLETE COMPARISON:  CT abdomen pelvis 10/23/2019. FINDINGS: Right Kidney: Renal measurements: 9.4 x 3.4 x 5.1 = volume: 84 mL. Increased echogenicity with prominent medullary pyramids. There is a 1.5 cm cystic lesion within the right kidney that is grossly stable in size compared to prior CT 2020. No solid mass or hydronephrosis visualized. Left Kidney: Renal measurements: 8.5 x 4.4 x 4 = volume: 79 mL. Increased echogenicity with prominent medullary pyramids. There is a 3.8 cm cystic lesion within the left  kidney that is grossly similar slightly decreased in size compared to prior CT 2020. No solid mass or hydronephrosis visualized. Urinary bladder: Appears normal for degree of bladder distention. Other: None. IMPRESSION: 1. Bilateral echogenic parenchyma suggestive of chronic renal disease. 2. Otherwise grossly unremarkable renal ultrasound. Electronically Signed   By: Iven Finn M.D.   On: 09/15/2020 01:55     Scheduled Meds: . aspirin EC  81 mg Oral Daily  . feeding supplement  237 mL Oral BID BM  . heparin  5,000 Units Subcutaneous Q8H  . metoprolol tartrate  50 mg Oral BID  . pantoprazole  40 mg Oral Daily   Continuous Infusions: . lactated ringers 75 mL/hr at 09/16/20 1325     LOS: 1 day   Time Spent in minutes   45 minutes  Rachel Ho D.O. on 09/16/2020 at 4:13 PM  Between 7am to 7pm - Please see pager noted on [email or web address]  After 7pm go to [email or web address]  And look for the night coverage person covering for me after hours  [facility] Office  [phone]

## 2020-09-16 NOTE — Progress Notes (Signed)
Initial Nutrition Assessment  DOCUMENTATION CODES:   Underweight  INTERVENTION:  Provide Ensure Enlive po BID, each supplement provides 350 kcal and 20 grams of protein.  Encourage adequate PO intake.   NUTRITION DIAGNOSIS:   Increased nutrient needs related to chronic illness (COPD) as evidenced by estimated needs.  GOAL:   Patient will meet greater than or equal to 90% of their needs  MONITOR:   PO intake, Skin, Supplement acceptance, Weight trends, Labs, I & O's  REASON FOR ASSESSMENT:   Consult Assessment of nutrition requirement/status  ASSESSMENT:   85 y.o. female with a past medical history significant for asthma, COPD, HTN, and recent AAA (August 2021) presents with AKI.  Meal completion 100% at lunch today. Pt reports appetite is fine. Pt reports having a decreased appetite at home, however would still try to consume at least 2-3 meals a day with a Premier protein shake once daily. Usual body weight unknown. RD to order nutritional supplements to aid in caloric and protein needs. Educated pt to continue nutritional supplementation at home with at least 2 shake a day along with 3 meals a day. Pt verbalized understanding.    NUTRITION - FOCUSED PHYSICAL EXAM:    Most Recent Value  Orbital Region Unable to assess  Upper Arm Region Mild depletion  Thoracic and Lumbar Region No depletion  Buccal Region Unable to assess  Temple Region Moderate depletion  Clavicle Bone Region Moderate depletion  Clavicle and Acromion Bone Region Moderate depletion  Scapular Bone Region Unable to assess  Dorsal Hand Unable to assess  Patellar Region No depletion  Anterior Thigh Region Moderate depletion  Posterior Calf Region No depletion  Edema (RD Assessment) None  Hair Reviewed  Eyes Reviewed  Mouth Reviewed  Skin Reviewed  Nails Reviewed     Labs and medications reviewed.   Diet Order:   Diet Order            Diet Heart Room service appropriate? Yes; Fluid  consistency: Thin  Diet effective now                 EDUCATION NEEDS:   Not appropriate for education at this time  Skin:  Skin Assessment: Reviewed RN Assessment  Last BM:  Unknown  Height:   Ht Readings from Last 1 Encounters:  09/14/20 5\' 5"  (1.651 m)    Weight:   Wt Readings from Last 1 Encounters:  09/16/20 45.6 kg   BMI:  Body mass index is 16.73 kg/m.  Estimated Nutritional Needs:   Kcal:  1500-1700  Protein:  65-80 grams  Fluid:  >/= 1.5 L/day  Corrin Parker, MS, RD, LDN RD pager number/after hours weekend pager number on Amion.

## 2020-09-16 NOTE — Consult Note (Signed)
[facility] Renal Consultation Note    Indication for Consultation:  AKI  EVO:JJKK, Gara Kroner, MD  HPI: Rachel Ho is a 85 y.o. female with a past medical history significant for asthma, COPD, HTN, and recent AAA (August 2021) admitted at the urging of her PCP for elevated BUN/Cr. Unknown baseline Cr, but BUN/Cr were found to be 116/4.04 on admission. AKI thought to be in the setting of steady decline of PO intake and overall functional ability to take care of herself. Patient also has been experiencing generalized weakness and 25-lb weight loss since June 2021. Her appetite is good but she does not like the taste of food on low-sodium diet. She does drink a protein supplement at home. She denies difficulty swallowing or choking with food. She does use dentures. She also endorses taking 1 tablet Advil everyday. She reports the last time she took one was yesterday. Son, who is at bedside, says she recently had surgery for AAA in August but he does not know if she received any antibiotics at that time. She denies using metformin, gabapentin, or any other drugs other than those listed on her medication list. During this admission, she has receiving NS IVF @125mL /hr with improvement noted in her BUN and Cr this AM (87/3.36). Her urine output has not been well-documented but her urine canister was empty at the time of our visit. She is unable to say how much she has been urinating.  She says she saw a Kidney doctor once when she was pregnant because she "couldn't pee." She denies any known family history of kidney disease.  She currently lives alone, though she states "someone is always there."   ROS: She endorses intermittent swelling in her lower extremities. She does have a morning cough and feels SOB in the morning, for which she takes her inhalers. She denies CP, orthopnea, nausea, vomiting, diarrhea, dysuria.  Past Medical History:  Diagnosis Date  . Asthma   . COPD (chronic  obstructive pulmonary disease) ([place])   . GERD (gastroesophageal reflux disease)   . High blood pressure    Past Surgical History:  Procedure Laterality Date  . ABDOMINAL HYSTERECTOMY  1984  . HERNIA REPAIR  1984   Family History  Problem Relation Age of Onset  . High blood pressure Mother    Social History:  reports that she quit smoking about 51 years ago. Her smoking use included cigarettes. She has a 50.00 pack-year smoking history. She has never used smokeless tobacco. She reports that she does not drink alcohol and does not use drugs. Allergies  Allergen Reactions  . Sulfa Antibiotics     Patient advised this was several years ago but she's had sulfur since then.    Prior to Admission medications   Medication Sig Start Date End Date Taking? Authorizing Provider  ASPIRIN LOW DOSE 81 MG EC tablet Take 81 mg by mouth daily. 07/10/20  Yes [provider]  ezetimibe (ZETIA) 10 MG tablet Take 10 mg by mouth daily. 09/14/20  Yes [provider]  fluconazole (DIFLUCAN) 100 MG tablet Take 100 mg by mouth daily.   Yes [provider]  furosemide (LASIX) 20 MG tablet Take 20 mg by mouth in the morning.  04/18/16  Yes [provider]  metoprolol (LOPRESSOR) 50 MG tablet Take 50 mg by mouth 2 (two) times daily.  05/03/16  Yes [provider]  olmesartan (BENICAR) 40 MG tablet Take 40 mg by mouth daily.  05/03/16  Yes [provider]  Pitavastatin Calcium (LIVALO) 1 MG TABS Take 1 mg by mouth daily.   Yes [provider]  potassium chloride SA (K-DUR,KLOR-CON) 20 MEQ tablet Take 20 mEq by mouth daily.  07/14/16  Yes [provider]  PROAIR HFA 108 (90 Base) MCG/ACT inhaler Inhale two puffs every four to six hours as needed for cough or wheeze. Patient taking differently: Inhale 2 puffs into the lungs See admin instructions. Inhale two puffs every four to six hours as needed for cough or wheeze. 11/07/17  Yes Kozlow, Donnamarie Poag, MD   SYMBICORT 160-4.5 MCG/ACT inhaler INHALE 2 PUFFS BY MOUTH 2 TIMES A DAY Patient taking differently: Inhale 2 puffs into the lungs in the morning and at bedtime.  04/07/17  Yes Kozlow, Donnamarie Poag, MD  tolterodine (DETROL LA) 2 MG 24 hr capsule Take 2 mg by mouth daily. 11/20/17  Yes [provider]  Cholecalciferol (VITAMIN D PO) Take by mouth daily.    [provider]  esomeprazole (NEXIUM) 40 MG capsule TAKE ONE CAPSULE BY MOUTH EVERY DAY Patient taking differently: Take 40 mg by mouth daily.  02/01/18   Kozlow, Donnamarie Poag, MD  Fluocinolone Acetonide 0.01 % OIL Place 1 drop in ear(s) daily as needed.    [provider]  fluticasone (FLONASE) 50 MCG/ACT nasal spray Place 2 sprays into both nostrils daily. 10/31/16   Kozlow, Donnamarie Poag, MD  LINZESS 145 MCG CAPS capsule as needed.  06/13/16   [provider]  loratadine (CLARITIN) 10 MG tablet Take 10 mg by mouth daily as needed for allergies.    [provider]  LUMIGAN 0.01 % SOLN  05/26/16   [provider]  mometasone (ELOCON) 0.1 % cream Apply to affected areas once daily until resolved Patient taking differently: Apply 1 application topically See admin instructions. Apply to affected areas once daily until resolved 01/25/17   Kozlow, Donnamarie Poag, MD  Multiple Vitamins-Calcium (ONE-A-DAY WOMENS PO) Take by mouth daily.    [provider]  nitroGLYCERIN (NITROSTAT) 0.4 MG SL tablet Place 0.4 mg under the tongue.    [provider]  oxybutynin (DITROPAN) 5 MG tablet  05/03/16   [provider]  rosuvastatin (CRESTOR) 10 MG tablet Take 10 mg by mouth daily. 07/10/20   [provider]  sodium chloride (SALINE MIST) 0.65 % nasal spray Place 1 spray into the nose as needed for congestion.    [provider]   Current Facility-Administered Medications  Medication Dose Route Frequency Provider Last Rate Last Admin  . albuterol (PROVENTIL) (2.5 MG/3ML) 0.083% nebulizer solution  2.5 mg  2.5 mg Inhalation Q6H PRN Cox, Amy N, DO      . aspirin EC tablet 81 mg  81 mg Oral Daily Cox, Amy N, DO   81 mg at 09/16/20 1055  . heparin injection 5,000 Units  5,000 Units Subcutaneous Q8H Cox, Amy N, DO   5,000 Units at 09/16/20 1324  . hydrALAZINE (APRESOLINE) injection 10 mg  10 mg Intravenous Q6H PRN Cox, Amy N, DO      . lactated ringers infusion   Intravenous Continuous Rexene Agent, MD 75 mL/hr at 09/16/20 1325 New Bag at 09/16/20 1325  . metoprolol tartrate (LOPRESSOR) tablet 50 mg  50 mg Oral BID Cox, Amy N, DO   50 mg at 09/15/20 2026  . nitroGLYCERIN (NITROSTAT) SL tablet 0.4 mg  0.4 mg Sublingual Q5 min PRN Cox, Amy N, DO      .  pantoprazole (PROTONIX) EC tablet 40 mg  40 mg Oral Daily Cox, Amy N, DO   40 mg at 09/16/20 1055   Labs: Basic Metabolic Panel: Recent Labs  Lab 09/14/20 1823 09/14/20 1823 09/15/20 0118 09/15/20 0819 09/16/20 0822  NA 137  --   --  142 141  K 5.0  --   --  4.6 3.7  CL 109  --   --  113* 115*  CO2 13*  --   --  19* 17*  GLUCOSE 130*  --   --  195* 107*  BUN 116*  --   --  112* 87*  CREATININE 4.04*   < > 4.14* 4.13* 3.36*  CALCIUM 12.0*  --   --  11.4* 10.0   < > = values in this interval not displayed.    Recent Labs  Lab 09/16/20 0822  AMMONIA 32   CBC: Recent Labs  Lab 09/14/20 1823 09/15/20 0118 09/16/20 0822  WBC 7.7 6.9 5.8  NEUTROABS  --   --  3.9  HGB 12.1 12.7 10.6*  HCT 37.9 39.8 33.2*  MCV 82.0 81.9 83.6  PLT 178 183 144*   Studies/Results: CT HEAD WO CONTRAST  Result Date: 09/15/2020 CLINICAL DATA:  85 year old female with delirium. EXAM: CT HEAD WITHOUT CONTRAST TECHNIQUE: Contiguous axial images were obtained from the base of the skull through the vertex without intravenous contrast. COMPARISON:  [facility] head CT 08/11/2019. FINDINGS: Brain: No midline shift, ventriculomegaly, mass effect, evidence of mass lesion, intracranial hemorrhage or evidence of cortically based acute infarction.  Cavum vergae, normal variant. Mild for age scattered white matter hypodensity. Otherwise normal gray-white matter differentiation. No cortical encephalomalacia identified. Vascular: Mild Calcified atherosclerosis at the skull base. No suspicious intracranial vascular hyperdensity. Skull: No acute osseous abnormality identified. Sinuses/Orbits: Visualized paranasal sinuses and mastoids are stable and well pneumatized. Other: No acute orbit or scalp soft tissue findings. IMPRESSION: 1. No acute intracranial abnormality. 2. Mild for age cerebral white matter changes, most commonly due to small vessel disease. Electronically Signed   By: Genevie Ann M.D.   On: 09/15/2020 15:07   US Renal  Result Date: 09/15/2020 CLINICAL DATA:  Acute renal injury EXAM: RENAL / URINARY TRACT ULTRASOUND COMPLETE COMPARISON:  CT abdomen pelvis 10/23/2019. FINDINGS: Right Kidney: Renal measurements: 9.4 x 3.4 x 5.1 = volume: 84 mL. Increased echogenicity with prominent medullary pyramids. There is a 1.5 cm cystic lesion within the right kidney that is grossly stable in size compared to prior CT 2020. No solid mass or hydronephrosis visualized. Left Kidney: Renal measurements: 8.5 x 4.4 x 4 = volume: 79 mL. Increased echogenicity with prominent medullary pyramids. There is a 3.8 cm cystic lesion within the left kidney that is grossly similar slightly decreased in size compared to prior CT 2020. No solid mass or hydronephrosis visualized. Urinary bladder: Appears normal for degree of bladder distention. Other: None. IMPRESSION: 1. Bilateral echogenic parenchyma suggestive of chronic renal disease. 2. Otherwise grossly unremarkable renal ultrasound. Electronically Signed   By: Iven Finn M.D.   On: 09/15/2020 01:55    ROS: All others negative except those listed in HPI.  Physical Exam: Vitals:   09/15/20 1621 09/16/20 0029 09/16/20 0540 09/16/20 1216  BP: 138/63 (!) 150/61 (!) 166/65 137/60  Pulse: (!) 53 (!) 51 (!) 52 (!) 59   Resp: 14 16 18 16   Temp: 98.5 F (36.9 C) 97.8 F (36.6 C) 97.6 F (36.4 C) (!) 97.4 F (36.3 C)  TempSrc:  Oral Oral Oral Oral  SpO2: 96% 99% 99% 100%  Weight:   45.6 kg   Height:         General: Elderly, frail African-American female sitting up in bed eating her lunch in NAD. Head: NCAT. Sclera not icteric. Moist mucus membranes. Has temporal wasting Neck: Supple. No lymphadenopathy Lungs: CTA bilaterally, anteriorly. No wheeze, rales or rhonchi. Breathing is unlabored. Heart: RRR. No murmur, rubs or gallops.  Abdomen: Soft, flat, and nontender, +BS, no guarding, no rebound tenderness.  Lower extremities: Trace edema in bilateral lower extremities. No ischemic changes or open wounds  Neuro: AAOx2. Moves all extremities spontaneously. Psych:  Appropriate affect and mood. Responds to questions inappropriately at times.   Assessment/Plan:  55F with renal failue of unclear chronicity (but renal US suggests longstanding); UA w/o features of sig glomerular disease; and unintentional weight loss / FTT.  Was hyercalcemic on admission. Since admit hydrated with NS, held diuretic/ARB.  SCr and BUN improved from admission. I suspect has a hypovolemic component but also some degree of CKD.  Will try to obtain historical labs from PCP for reference.  Send of myeloma eval.  Hold ARB and NSAIDs.  Change NS to LR.  Trend labs.  No HD indicated yet, will see how she does.    1. Probably AoCKD, specifics unclear.  Imaging w/o acute structural issues but a degree of medical renal disease. UA w/o hematuria/proteinuria.  Improved with hydration and holding ARB. 2. Hypercalcemia mild/moderate, improving.  Check SPEP, sFLC.  No sig cytopenias.  Check PTH and PTHrP.  Need Phos. Check globulin gap 3. HTN, BP ok here, holding meds 4. COPD 5. FTT, unintentional weight loss 6. ? AAA    Rexene Agent  09/16/2020, 1:45 PM

## 2020-09-17 DIAGNOSIS — I10 Essential (primary) hypertension: Secondary | ICD-10-CM | POA: Diagnosis not present

## 2020-09-17 DIAGNOSIS — N179 Acute kidney failure, unspecified: Secondary | ICD-10-CM | POA: Diagnosis not present

## 2020-09-17 DIAGNOSIS — E46 Unspecified protein-calorie malnutrition: Secondary | ICD-10-CM | POA: Diagnosis not present

## 2020-09-17 LAB — PTH, INTACT AND CALCIUM
Calcium, Total (PTH): 9.3 mg/dL (ref 8.7–10.3)
PTH: 43 pg/mL (ref 15–65)

## 2020-09-17 LAB — COMPREHENSIVE METABOLIC PANEL
ALT: 42 U/L (ref 0–44)
AST: 87 U/L — ABNORMAL HIGH (ref 15–41)
Albumin: 2.5 g/dL — ABNORMAL LOW (ref 3.5–5.0)
Alkaline Phosphatase: 52 U/L (ref 38–126)
Anion gap: 9 (ref 5–15)
BUN: 79 mg/dL — ABNORMAL HIGH (ref 8–23)
CO2: 18 mmol/L — ABNORMAL LOW (ref 22–32)
Calcium: 9.7 mg/dL (ref 8.9–10.3)
Chloride: 116 mmol/L — ABNORMAL HIGH (ref 98–111)
Creatinine, Ser: 3.12 mg/dL — ABNORMAL HIGH (ref 0.44–1.00)
GFR, Estimated: 13 mL/min — ABNORMAL LOW (ref >60)
Glucose, Bld: 77 mg/dL (ref 70–99)
Potassium: 4.1 mmol/L (ref 3.5–5.1)
Sodium: 143 mmol/L (ref 135–145)
Total Bilirubin: 0.2 mg/dL — ABNORMAL LOW (ref 0.3–1.2)
Total Protein: 5.9 g/dL — ABNORMAL LOW (ref 6.5–8.1)

## 2020-09-17 LAB — KAPPA/LAMBDA LIGHT CHAINS
Kappa free light chain: 113.6 mg/L — ABNORMAL HIGH (ref 3.3–19.4)
Kappa, lambda light chain ratio: 2 — ABNORMAL HIGH (ref 0.26–1.65)
Lambda free light chains: 56.8 mg/L — ABNORMAL HIGH (ref 5.7–26.3)

## 2020-09-17 LAB — T4, FREE: Free T4: 0.3 ng/dL — ABNORMAL LOW (ref 0.61–1.12)

## 2020-09-17 NOTE — Progress Notes (Addendum)
PROGRESS NOTE    Rachel Ho  QBH:[id number] DOB: 06-22-1935 DOA: 09/14/2020 PCP: Ronita Hipps, MD   Brief Narrative:  HPI On 09/15/2020 by Dr. Rupert Stacks Rachel Ho is a 85 y.o. female with medical history significant for asthma and hypertension presents per PCP for concerns of abnormal lab.   She endorses poor po intake since November 2020 due to her salt was taken away. She endorses baseline generalized weakness. She denies urinary/bowel changes.   She denies chest pain, shortness of breath, abdominal pain, diarrhea, constipation, nausea, vomiting, vision changes, headache, fever, chills, cough beyond baseline Am cough.   She states food still tastes good to her, only that since salt is taken way, food does not taste good anymore.   She lives at home by herself.  Interim history Patient found to have AKI and placed on IV fluids.  Nephrology consulted and appreciated. Assessment & Plan   Acute kidney injury -Unclear etiology, possibly secondary to poor oral intake in conjunction with medications -Patient's creatinine level was approximately 0.9 back in June 2021 -Patient stated that since her salt intake has been reduced, she has had very poor appetite -Patient currently on IV fluids -Renal ultrasound showed bilateral echogenic parenchyma suggestive of chronic renal disease and otherwise grossly unremarkable renal ultrasound  -Nephrology consulted and appreciated -Creatinine on admission was 4.04 however increased to 4.14.  Today down to 3.12 -Continue to monitor BMP  Essential hypertension -Home medications of olmesartan, Lasix held -Continue metoprolol as well as hydralazine  Malnutrition/Underweight -Dietitian consulted -Recommended feeding supplementation -Will order daily weights as well as intake and output  Asthma -Currently no wheezing, continue albuterol as needed  GERD -Continue PPI  Physical deconditioning  -PT recommended HH vs SNF pending  patient's progress -Will consult OT as well  Abnormal thyroid function -TSH 232.638 -Will order FT4  -may need to start synthroid  DVT Prophylaxis Heparin  Code Status: Full  Family Communication: None at bedside  Disposition Plan:  Status is: Inpatient  Remains inpatient appropriate because:IV treatments appropriate due to intensity of illness or inability to take PO and Inpatient level of care appropriate due to severity of illness.  Pending improvement in creatinine.   Dispo: The patient is from: Home              Anticipated d/c is to: TBD              Anticipated d/c date is: > 3 days              Patient currently is not medically stable to d/c.  Consultants Nephrology   Procedures  Renal US  Antibiotics   Anti-infectives (From admission, onward)   None      Subjective:   Rachel Ho seen and examined today.  Patient has no complaints today.  Denies current chest pain, shortness of breath, abdominal pain, nausea or vomiting, diarrhea or constipation, dizziness or headache.  Feels her appetite is mildly improving.  Objective:   Vitals:   09/16/20 1821 09/17/20 0025 09/17/20 0506 09/17/20 0929  BP: (!) 127/53 (!) 161/67 (!) 166/73   Pulse: 62 (!) 59 (!) 59 71  Resp: 16 18 18    Temp: (!) 97.3 F (36.3 C) 98.6 F (37 C) 98.4 F (36.9 C)   TempSrc: Oral Oral Oral   SpO2: 100% 100% 100%   Weight:   47.6 kg   Height:        Intake/Output Summary (Last 24 hours) at 09/17/2020 1011 Last  data filed at 09/17/2020 0500 Gross per 24 hour  Intake 1480.11 ml  Output 200 ml  Net 1280.11 ml   Filed Weights   09/14/20 1812 09/16/20 0540 09/17/20 0506  Weight: 54.4 kg 45.6 kg 47.6 kg   Exam  General: Well developed, thin, frail, elderly, NAD  HEENT: NCAT, mucous membranes moist.   Cardiovascular: S1 S2 auscultated, RRR  Respiratory: Clear to auscultation bilaterally, no wheezing  Abdomen: Soft, nontender, nondistended, + bowel  sounds  Extremities: warm dry without cyanosis clubbing or edema  Neuro: AAOx3, nonfocal  Psych: appropriate mood and affect  Data Reviewed: I have personally reviewed following labs and imaging studies  CBC: Recent Labs  Lab 09/14/20 1823 09/15/20 0118 09/16/20 0822  WBC 7.7 6.9 5.8  NEUTROABS  --   --  3.9  HGB 12.1 12.7 10.6*  HCT 37.9 39.8 33.2*  MCV 82.0 81.9 83.6  PLT 178 183 725*   Basic Metabolic Panel: Recent Labs  Lab 09/14/20 1823 09/15/20 0118 09/15/20 0819 09/16/20 0822 09/16/20 1420 09/17/20 0035  NA 137  --  142 141  --  143  K 5.0  --  4.6 3.7  --  4.1  CL 109  --  113* 115*  --  116*  CO2 13*  --  19* 17*  --  18*  GLUCOSE 130*  --  195* 107*  --  77  BUN 116*  --  112* 87*  --  79*  CREATININE 4.04* 4.14* 4.13* 3.36*  --  3.12*  CALCIUM 12.0*  --  11.4* 10.0 9.3 9.7  MG  --   --   --  2.3  --   --   PHOS  --   --   --   --  2.4*  --    GFR: Estimated Creatinine Clearance: 10.3 mL/min (A) (by C-G formula based on SCr of 3.12 mg/dL (H)). Liver Function Tests: Recent Labs  Lab 09/17/20 0035  AST 87*  ALT 42  ALKPHOS 52  BILITOT 0.2*  PROT 5.9*  ALBUMIN 2.5*   No results for input(s): LIPASE, AMYLASE in the last 168 hours. Recent Labs  Lab 09/16/20 0822  AMMONIA 32   Coagulation Profile: No results for input(s): INR, PROTIME in the last 168 hours. Cardiac Enzymes: No results for input(s): CKTOTAL, CKMB, CKMBINDEX, TROPONINI in the last 168 hours. BNP (last 3 results) No results for input(s): PROBNP in the last 8760 hours. HbA1C: No results for input(s): HGBA1C in the last 72 hours. CBG: No results for input(s): GLUCAP in the last 168 hours. Lipid Profile: No results for input(s): CHOL, HDL, LDLCALC, TRIG, CHOLHDL, LDLDIRECT in the last 72 hours. Thyroid Function Tests: Recent Labs    09/16/20 0822  TSH 232.638*   Anemia Panel: Recent Labs    09/16/20 0822  VITAMINB12 6,473*  FOLATE 22.8   Urine analysis:     Component Value Date/Time   COLORURINE YELLOW 09/14/2020 2241   APPEARANCEUR HAZY (A) 09/14/2020 2241   LABSPEC 1.011 09/14/2020 2241   PHURINE 5.0 09/14/2020 2241   GLUCOSEU NEGATIVE 09/14/2020 2241   HGBUR MODERATE (A) 09/14/2020 2241   BILIRUBINUR NEGATIVE 09/14/2020 2241   [place] 09/14/2020 2241   PROTEINUR 30 (A) 09/14/2020 2241   NITRITE NEGATIVE 09/14/2020 2241   LEUKOCYTESUR SMALL (A) 09/14/2020 2241   Sepsis Labs: @LABRCNTIP (procalcitonin:4,lacticidven:4)  ) Recent Results (from the past 240 hour(s))  Respiratory Panel by RT PCR (Flu A&B, Covid) - Nasopharyngeal Swab  Status: None   Collection Time: 09/15/20  2:36 AM   Specimen: Nasopharyngeal Swab  Result Value Ref Range Status   SARS Coronavirus 2 by RT PCR NEGATIVE NEGATIVE Final    Comment: (NOTE) SARS-CoV-2 target nucleic acids are NOT DETECTED.  The SARS-CoV-2 RNA is generally detectable in upper respiratoy specimens during the acute phase of infection. The lowest concentration of SARS-CoV-2 viral copies this assay can detect is 131 copies/mL. A negative result does not preclude SARS-Cov-2 infection and should not be used as the sole basis for treatment or other patient management decisions. A negative result may occur with  improper specimen collection/handling, submission of specimen other than nasopharyngeal swab, presence of viral mutation(s) within the areas targeted by this assay, and inadequate number of viral copies (<131 copies/mL). A negative result must be combined with clinical observations, patient history, and epidemiological information. The expected result is Negative.  Fact Sheet for Patients:  PinkCheek.be  Fact Sheet for Healthcare Providers:  GravelBags.it  This test is no t yet approved or cleared by the [place] FDA and  has been authorized for detection and/or diagnosis of SARS-CoV-2 by FDA under an  Emergency Use Authorization (EUA). This EUA will remain  in effect (meaning this test can be used) for the duration of the COVID-19 declaration under Section 564(b)(1) of the Act, 21 U.S.C. section 360bbb-3(b)(1), unless the authorization is terminated or revoked sooner.     Influenza A by PCR NEGATIVE NEGATIVE Final   Influenza B by PCR NEGATIVE NEGATIVE Final    Comment: (NOTE) The Xpert Xpress SARS-CoV-2/FLU/RSV assay is intended as an aid in  the diagnosis of influenza from Nasopharyngeal swab specimens and  should not be used as a sole basis for treatment. Nasal washings and  aspirates are unacceptable for Xpert Xpress SARS-CoV-2/FLU/RSV  testing.  Fact Sheet for Patients: PinkCheek.be  Fact Sheet for Healthcare Providers: GravelBags.it  This test is not yet approved or cleared by the [place] FDA and  has been authorized for detection and/or diagnosis of SARS-CoV-2 by  FDA under an Emergency Use Authorization (EUA). This EUA will remain  in effect (meaning this test can be used) for the duration of the  Covid-19 declaration under Section 564(b)(1) of the Act, 21  U.S.C. section 360bbb-3(b)(1), unless the authorization is  terminated or revoked. Performed at [facility], [place] [address], [place], [place] [address]       Radiology Studies: CT HEAD WO CONTRAST  Result Date: 09/15/2020 CLINICAL DATA:  85 year old female with delirium. EXAM: CT HEAD WITHOUT CONTRAST TECHNIQUE: Contiguous axial images were obtained from the base of the skull through the vertex without intravenous contrast. COMPARISON:  [facility] head CT 08/11/2019. FINDINGS: Brain: No midline shift, ventriculomegaly, mass effect, evidence of mass lesion, intracranial hemorrhage or evidence of cortically based acute infarction. Cavum vergae, normal variant. Mild for age scattered white matter hypodensity. Otherwise normal gray-white  matter differentiation. No cortical encephalomalacia identified. Vascular: Mild Calcified atherosclerosis at the skull base. No suspicious intracranial vascular hyperdensity. Skull: No acute osseous abnormality identified. Sinuses/Orbits: Visualized paranasal sinuses and mastoids are stable and well pneumatized. Other: No acute orbit or scalp soft tissue findings. IMPRESSION: 1. No acute intracranial abnormality. 2. Mild for age cerebral white matter changes, most commonly due to small vessel disease. Electronically Signed   By: Genevie Ann M.D.   On: 09/15/2020 15:07     Scheduled Meds: . aspirin EC  81 mg Oral Daily  . feeding supplement  237 mL Oral BID BM  . heparin  5,000 Units Subcutaneous Q8H  . metoprolol tartrate  50 mg Oral BID  . pantoprazole  40 mg Oral Daily   Continuous Infusions: . lactated ringers 75 mL/hr at 09/17/20 0440     LOS: 2 days   Time Spent in minutes   30 minutes  Dorreen Valiente D.O. on 09/17/2020 at 10:11 AM  Between 7am to 7pm - Please see pager noted on [email or web address]  After 7pm go to [email or web address]  And look for the night coverage person covering for me after hours  [facility] Office  [phone]

## 2020-09-17 NOTE — TOC Initial Note (Signed)
Transition of Care [facility]) - Initial/Assessment Note    Patient Details  Name: Rachel Ho MRN: [id number] Date of Birth: 19-Oct-1935  Transition of Care [facility]) CM/SW Contact:    Bethann Berkshire, [place] Phone Number: 09/17/2020, 3:12 PM  Clinical Narrative:                  CSW met with pt for SNF vs [facility] consult. Pt lives at home alone in [place]. She does not have any equipment or [place] servives. CSW expressed concern that pt doesn't have 24/hour supervision. She states she has 3 sons and 3 daughters who would be able to support here. She consents to csw calling her son  Wende Crease.  CSW called Wende Crease and confirms pt lives home alone. He explains she does have 6 children who may be able to assist. He plans to contact rest of his family to discuss decision of snf vs hh. He will call csw with decision.  Expected Discharge Plan: Skilled Nursing Facility Barriers to Discharge: Continued Medical Work up   Patient Goals and CMS Choice        Expected Discharge Plan and Services Expected Discharge Plan: [place] Acute Care Choice: [place] arrangements for the past 2 months: Single Family Home                                      Prior Living Arrangements/Services Living arrangements for the past 2 months: Single Family Home Lives with:: Self Patient language and need for interpreter reviewed:: Yes        Need for Family Participation in Patient Care: Yes (Comment) Care giver support system in place?: No (comment)   Criminal Activity/Legal Involvement Pertinent to Current Situation/Hospitalization: No - Comment as needed  Activities of Daily Living Home Assistive Devices/Equipment: None ADL Screening (condition at time of admission) Patient's cognitive ability adequate to safely complete daily activities?: Yes Is the patient deaf or have difficulty hearing?: No Does the patient have difficulty seeing, even when wearing glasses/contacts?:  No Does the patient have difficulty concentrating, remembering, or making decisions?: Yes Patient able to express need for assistance with ADLs?: No Does the patient have difficulty dressing or bathing?: Yes Independently performs ADLs?: No Does the patient have difficulty walking or climbing stairs?: Yes Weakness of Legs: Both Weakness of Arms/Hands: Both  Permission Sought/Granted   Permission granted to share information with : Yes, Verbal Permission Granted  Share Information with NAME: Adin, Lariccia (Son) [phone] [facility])           Emotional Assessment Appearance:: Appears stated age Attitude/Demeanor/Rapport: Lethargic Affect (typically observed): Anxious Orientation: : Oriented to Self, Oriented to Place, Oriented to Situation Alcohol / Substance Use: Not Applicable Psych Involvement: No (comment)  Admission diagnosis:  Uremia [N19] Generalized weakness [R53.1] AKI (acute kidney injury) ([place]) [N17.9] Patient Active Problem List   Diagnosis Date Noted  . AKI (acute kidney injury) ([place]) 09/15/2020  . Essential hypertension 09/15/2020  . Protein malnutrition ([place]) 09/15/2020  . Generalized weakness   . Obstructive lung disease (generalized) ([place]) 09/16/2015  . Allergic rhinitis 09/16/2015  . Nonallergic rhinitis 09/16/2015  . Laryngopharyngeal reflux (LPR) 09/16/2015   PCP:  Ronita Hipps, MD Pharmacy:   [place], [address] [address] [place] [address] Phone: [phone] Fax: [phone]  [id number]  [address] AT [address] [place] [address] Phone: [phone] Fax: [phone]     Social Determinants of Health (SDOH) Interventions    Readmission Risk Interventions No flowsheet data found.

## 2020-09-17 NOTE — Progress Notes (Signed)
[facility] Progress Note   Subjective:   Patient seen and examined at bedside. She is in good spirits and feels she is "on the mend." Her BUN/Cr have improved - down to 79/3.12 today. UOP only 537mL in the last 24hrs - question accuracy. She likes the "good food" here and reports good appetite.  Denies CP, SOB, nausea, vomiting, headache, diarrhea.  Objective Vitals:   09/16/20 1821 09/17/20 0025 09/17/20 0506 09/17/20 0929  BP: (!) 127/53 (!) 161/67 (!) 166/73   Pulse: 62 (!) 59 (!) 59 71  Resp: 16 18 18    Temp: (!) 97.3 F (36.3 C) 98.6 F (37 C) 98.4 F (36.9 C)   TempSrc: Oral Oral Oral   SpO2: 100% 100% 100%   Weight:   47.6 kg   Height:       Physical Exam General: Elderly, frail African-American female sitting up in bed eating her lunch in NAD. Head: NCAT. Sclera not icteric. Moist mucus membranes. Has temporal wasting Neck: Supple. No lymphadenopathy Lungs: CTA bilaterally, anteriorly. No wheeze, rales or rhonchi. Breathing is unlabored. Heart: RRR. No murmur, rubs or gallops.  Abdomen: Soft, flat, and nontender, +BS, no guarding, no rebound tenderness.  Lower extremities: Trace edema in bilateral lower extremities. No ischemic changes or open wounds  Neuro: AAOx2. Moves all extremities spontaneously. Psych:  Appropriate affect and mood. Responds to questions inappropriately at times.  Filed Weights   09/14/20 1812 09/16/20 0540 09/17/20 0506  Weight: 54.4 kg 45.6 kg 47.6 kg    Intake/Output Summary (Last 24 hours) at 09/17/2020 1219 Last data filed at 09/17/2020 0800 Gross per 24 hour  Intake 1467.11 ml  Output 200 ml  Net 1267.11 ml    Additional Objective Labs: Basic Metabolic Panel: Recent Labs  Lab 09/15/20 0819 09/15/20 0819 09/16/20 0822 09/16/20 1420 09/17/20 0035  NA 142  --  141  --  143  K 4.6  --  3.7  --  4.1  CL 113*  --  115*  --  116*  CO2 19*  --  17*  --  18*  GLUCOSE 195*  --  107*  --  77  BUN 112*  --  87*  --   79*  CREATININE 4.13*  --  3.36*  --  3.12*  CALCIUM 11.4*   < > 10.0 9.3 9.7  PHOS  --   --   --  2.4*  --    < > = values in this interval not displayed.   Liver Function Tests: Recent Labs  Lab 09/17/20 0035  AST 87*  ALT 42  ALKPHOS 52  BILITOT 0.2*  PROT 5.9*  ALBUMIN 2.5*   CBC: Recent Labs  Lab 09/14/20 1823 09/15/20 0118 09/16/20 0822  WBC 7.7 6.9 5.8  NEUTROABS  --   --  3.9  HGB 12.1 12.7 10.6*  HCT 37.9 39.8 33.2*  MCV 82.0 81.9 83.6  PLT 178 183 144*   Studies/Results: CT HEAD WO CONTRAST  Result Date: 09/15/2020 CLINICAL DATA:  85 year old female with delirium. EXAM: CT HEAD WITHOUT CONTRAST TECHNIQUE: Contiguous axial images were obtained from the base of the skull through the vertex without intravenous contrast. COMPARISON:  [facility] head CT 08/11/2019. FINDINGS: Brain: No midline shift, ventriculomegaly, mass effect, evidence of mass lesion, intracranial hemorrhage or evidence of cortically based acute infarction. Cavum vergae, normal variant. Mild for age scattered white matter hypodensity. Otherwise normal gray-white matter differentiation. No cortical encephalomalacia identified. Vascular: Mild Calcified atherosclerosis at the  skull base. No suspicious intracranial vascular hyperdensity. Skull: No acute osseous abnormality identified. Sinuses/Orbits: Visualized paranasal sinuses and mastoids are stable and well pneumatized. Other: No acute orbit or scalp soft tissue findings. IMPRESSION: 1. No acute intracranial abnormality. 2. Mild for age cerebral white matter changes, most commonly due to small vessel disease. Electronically Signed   By: Genevie Ann M.D.   On: 09/15/2020 15:07    Medications: . lactated ringers 75 mL/hr at 09/17/20 0440   . aspirin EC  81 mg Oral Daily  . feeding supplement  237 mL Oral BID BM  . heparin  5,000 Units Subcutaneous Q8H  . metoprolol tartrate  50 mg Oral BID  . pantoprazole  40 mg Oral Daily     Assessment/Plan: 34F with AKI with baseline Cr 0.9 in June 2021; UA w/o features of sig glomerular disease; and unintentional weight loss / FTT.  Was hyercalcemic on admission. Since admit hydrated with NS, held diuretic/ARB.  SCr and BUN improved from admission. I suspect has a hypovolemic component but also some degree of CKD. Myeloma eval sent. Hold ARB and NSAIDs.   1. AKI - Improving with hydration and holding ARB. Continue LR for another 24hrs and monitor RFP daily. Imaging w/o acute structural issues but a degree of medical renal disease. UA w/o hematuria/proteinuria. 2. Hypercalcemia mild/moderate - improving. Corrected calcium 10.9 today. Sent SPEP, sFLC.  No sig cytopenias. PTH  - WNL. Phosphorous low at 2.5 - Improving. Gamma gap 3.4 - not elevated. 3. HTN - BP ok here, holding meds 4. Elevated TSH- newly discovered this admission. Management per primary. 5. COPD 6. FTT, unintentional weight loss - RD following. Pt likes food here, continue to optimize nutrition. 7. ? AAA  [facility] 09/17/2020,12:19 PM  LOS: 2 days

## 2020-09-17 NOTE — Evaluation (Signed)
Occupational Therapy Evaluation Patient Details Name: Rachel Ho MRN: [id number] DOB: 1935-06-08 Today's Date: 09/17/2020    History of Present Illness Pt is 85 y.o. female with a past medical history significant for asthma, COPD, HTN, and recent AAA (August 2021) admitted at the urging of her PCP for elevated BUN/C. Pt states she has had poor PO intake and increased weakness over the last few months.   Clinical Impression   Pt. Was cooperative with therapy. Pt. States that one of her sons lives with her. She states she can have 24/7 care if needed. Pt. Was able to sit eob for perform ADLs. Pt. Was Min A with transfer to commode with use of walker. Pt. Has increased weakness and decreased function from baseline. Acute ot to follow.    Follow Up Recommendations  SNF;Supervision/Assistance - 24 hour    Equipment Recommendations       Recommendations for Other Services       Precautions / Restrictions Precautions Precautions: Fall Restrictions Weight Bearing Restrictions: No      Mobility Bed Mobility Overal bed mobility: Needs Assistance       Supine to sit: Supervision Sit to supine: Supervision   General bed mobility comments: cues for safety and hand placement on bedrails  Transfers Overall transfer level: Needs assistance Equipment used: Standard walker Transfers: Sit to/from Stand Sit to Stand: Min assist Stand pivot transfers: Min assist            Balance Overall balance assessment: Needs assistance Sitting-balance support: Feet supported Sitting balance-Leahy Scale: Good Sitting balance - Comments: able to sit EOB without PT support Postural control: Posterior lean Standing balance support: Single extremity supported Standing balance-Leahy Scale: Poor Standing balance comment: reliant on external assist                           ADL either performed or assessed with clinical judgement   ADL Overall ADL's : Needs  assistance/impaired Eating/Feeding: Set up   Grooming: Wash/dry hands;Wash/dry face;Sitting;Set up   Upper Body Bathing: Minimal assistance;Sitting   Lower Body Bathing: Sit to/from stand;Moderate assistance   Upper Body Dressing : Minimal assistance;Sitting   Lower Body Dressing: Moderate assistance;Sit to/from stand   Toilet Transfer: Minimal assistance;BSC;Stand-pivot   Toileting- Water quality scientist and Hygiene: Moderate assistance;Sit to/from stand       Functional mobility during ADLs: Minimal assistance;Rolling walker General ADL Comments: Pt. was able to sit eob with good balance for adls.      Vision Baseline Vision/History: Wears glasses Wears Glasses: Reading only Patient Visual Report: No change from baseline       Perception     Praxis      Pertinent Vitals/Pain Pain Assessment: 0-10 Pain Score: 2  Pain Location: IV site, RUE Pain Descriptors / Indicators: Sore;Discomfort Pain Intervention(s): Limited activity within patient's tolerance     Hand Dominance Right   Extremity/Trunk Assessment Upper Extremity Assessment Upper Extremity Assessment: Generalized weakness   Lower Extremity Assessment Lower Extremity Assessment: Defer to PT evaluation   Cervical / Trunk Assessment Cervical / Trunk Assessment: Kyphotic   Communication Communication Communication: No difficulties   Cognition Arousal/Alertness: Awake/alert Behavior During Therapy: WFL for tasks assessed/performed Overall Cognitive Status: Within Functional Limits for tasks assessed                                     General Comments  VSS    Exercises General Exercises - Lower Extremity Long Arc Quad: AROM;Both;10 reps;Seated Hip Flexion/Marching: AROM;Both;10 reps;Seated   Shoulder Instructions      Home Living Family/patient expects to be discharged to:: Private residence Living Arrangements: Children Available Help at Discharge: Family Type of Home:  House Home Access: Level entry     Home Layout: One level     Bathroom Shower/Tub: Occupational psychologist: [place]: Environmental consultant - 4 wheels;Shower seat;Hand held shower head;Grab bars - tub/shower;Grab bars - toilet   Additional Comments: information obtained from pt.       Prior Functioning/Environment Level of Independence: Independent        Comments: pt states her siblings and children check on her regularly; she has family who helps wtih cooking and cleaning; she states she still drives and is able to have someone assist her 24/7 upon discharge.        OT Problem List: Decreased strength;Decreased activity tolerance;Impaired balance (sitting and/or standing);Decreased knowledge of use of DME or AE      OT Treatment/Interventions: Self-care/ADL training;DME and/or AE instruction;Therapeutic activities;Patient/family education    OT Goals(Current goals can be found in the care plan section) Acute Rehab OT Goals Patient Stated Goal: i want to get better OT Goal Formulation: With patient Time For Goal Achievement: 10/01/20 Potential to Achieve Goals: Good ADL Goals Pt Will Perform Grooming: with modified independence;standing Pt Will Perform Upper Body Bathing: with modified independence;sitting Pt Will Perform Lower Body Bathing: with modified independence;sit to/from stand Pt Will Perform Upper Body Dressing: with modified independence;sitting Pt Will Perform Lower Body Dressing: with modified independence;sit to/from stand Pt Will Transfer to Toilet: with modified independence;ambulating Pt Will Perform Toileting - Clothing Manipulation and hygiene: with modified independence;sit to/from stand Pt Will Perform Tub/Shower Transfer: ambulating;with supervision  OT Frequency: Min 2X/week   Barriers to D/C:  (Pt. states she can have 24/7 if she needs it.)          Co-evaluation              AM-PAC OT "6 Clicks" Daily Activity      Outcome Measure Help from another person eating meals?: A Little Help from another person taking care of personal grooming?: A Little Help from another person toileting, which includes using toliet, bedpan, or urinal?: A Little Help from another person bathing (including washing, rinsing, drying)?: A Lot Help from another person to put on and taking off regular upper body clothing?: A Little Help from another person to put on and taking off regular lower body clothing?: A Lot 6 Click Score: 16   End of Session Equipment Utilized During Treatment: Gait belt Nurse Communication:  (ok therapy)  Activity Tolerance: Patient tolerated treatment well Patient left: in bed;with call bell/phone within reach;with bed alarm set  OT Visit Diagnosis: Unsteadiness on feet (R26.81)                Time: 5364-6803 OT Time Calculation (min): 46 min Charges:  OT General Charges $OT Visit: 1 Visit OT Evaluation $OT Eval Moderate Complexity: 1 Mod OT Treatments $Self Care/Home Management : 8-22 mins  Reece Packer OT/L   Daliyah Sramek 09/17/2020, 1:44 PM

## 2020-09-17 NOTE — Progress Notes (Signed)
Physical Therapy Treatment Patient Details Name: Rachel Ho MRN: [id number] DOB: 07-14-1935 Today's Date: 09/17/2020    History of Present Illness Pt is 85 y.o. female with a past medical history significant for asthma, COPD, HTN, and recent AAA (August 2021) admitted at the urging of her PCP for elevated BUN/C. Pt states she has had poor PO intake and increased weakness over the last few months.    PT Comments    Pt requiring overall less physical assist with mobility today vs eval yesterday, but continues to present with significant unsteadiness during mobility as well as very limited activity tolerance. Pt with poor use of RW during mobility, requiring multimodal cuing for safe use. Gait trial without AD unsafe this day. SNF is definitely safest d/c option for pt given high fall risk and weakness, but pt remains set on d/c home. Therefore, PT recommending HHPT with 24/7 assist from family. PT encouraged pt to physically have someone with her during all mobility at home, pt agreeable.     Follow Up Recommendations  Home health PT;SNF;Supervision/Assistance - 24 hour     Equipment Recommendations  Rolling walker with 5" wheels;3in1 (PT)    Recommendations for Other Services       Precautions / Restrictions Precautions Precautions: Fall Restrictions Weight Bearing Restrictions: No    Mobility  Bed Mobility Overal bed mobility: Needs Assistance Bed Mobility: Supine to Sit;Sit to Supine     Supine to sit: Supervision Sit to supine: Supervision   General bed mobility comments: for safety, very increased time with use of bedrails to perform.  Transfers Overall transfer level: Needs assistance Equipment used: Rolling walker (2 wheeled) Transfers: Sit to/from Stand Sit to Stand: Min assist Stand pivot transfers: Min assist       General transfer comment: Min assist for power up, steadying, pivot to BSC. stand x2, verbal cuing for hand and foot placement when  rising/sitting. Posterior bias upon initial standing, requiring posterior assist to correct.  Ambulation/Gait Ambulation/Gait assistance: Min assist Gait Distance (Feet): 20 Feet Assistive device: Rolling walker (2 wheeled) Gait Pattern/deviations: Step-through pattern;Decreased stride length;Shuffle;Trunk flexed Gait velocity: decr   General Gait Details: min assist to steady especially during directional changes, physically guide RW during mobility. Poor placement in RW throughout mobility, requiring verbal and visual cuing for placement within RW. Additional verbal cuing for upright posture, taking larger steps.   Stairs             Wheelchair Mobility    Modified Rankin (Stroke Patients Only)       Balance Overall balance assessment: Needs assistance Sitting-balance support: Feet supported Sitting balance-Leahy Scale: Fair Sitting balance - Comments: able to sit EOB without PT support Postural control: Posterior lean Standing balance support: Single extremity supported Standing balance-Leahy Scale: Poor Standing balance comment: reliant on external assist                            Cognition Arousal/Alertness: Awake/alert Behavior During Therapy: WFL for tasks assessed/performed Overall Cognitive Status: Within Functional Limits for tasks assessed                                 General Comments: increased processing time, poor insight into deficits and carryover into d/c home      Exercises General Exercises - Lower Extremity Long Arc Quad: AROM;Both;10 reps;Seated Hip Flexion/Marching: AROM;Both;10 reps;Seated    General Comments  General comments (skin integrity, edema, etc.): VSS      Pertinent Vitals/Pain Pain Assessment: Faces Faces Pain Scale: Hurts a little bit Pain Location: IV site, RUE Pain Descriptors / Indicators: Sore;Discomfort Pain Intervention(s): Limited activity within patient's tolerance;Monitored during  session;Repositioned    Home Living                      Prior Function            PT Goals (current goals can now be found in the care plan section) Acute Rehab PT Goals Patient Stated Goal: I want to go home PT Goal Formulation: With patient Time For Goal Achievement: 09/30/20 Potential to Achieve Goals: Fair Progress towards PT goals: Progressing toward goals    Frequency    Min 3X/week      PT Plan Current plan remains appropriate    Co-evaluation              AM-PAC PT "6 Clicks" Mobility   Outcome Measure  Help needed turning from your back to your side while in a flat bed without using bedrails?: A Little Help needed moving from lying on your back to sitting on the side of a flat bed without using bedrails?: A Little Help needed moving to and from a bed to a chair (including a wheelchair)?: A Little Help needed standing up from a chair using your arms (e.g., wheelchair or bedside chair)?: A Little Help needed to walk in hospital room?: A Little Help needed climbing 3-5 steps with a railing? : A Lot 6 Click Score: 17    End of Session Equipment Utilized During Treatment: Gait belt Activity Tolerance: Patient tolerated treatment well Patient left: in bed;with call bell/phone within reach;with bed alarm set Nurse Communication: Mobility status PT Visit Diagnosis: Unsteadiness on feet (R26.81);Other abnormalities of gait and mobility (R26.89);Muscle weakness (generalized) (M62.81)     Time: 1660-6301 PT Time Calculation (min) (ACUTE ONLY): 25 min  Charges:  $Gait Training: 8-22 mins $Therapeutic Activity: 8-22 mins                     Rachel Ho E, PT Acute Rehabilitation Services Pager [phone]  Office [phone]   Rachel Ho 09/17/2020, 10:29 AM

## 2020-09-18 DIAGNOSIS — I10 Essential (primary) hypertension: Secondary | ICD-10-CM | POA: Diagnosis not present

## 2020-09-18 DIAGNOSIS — E46 Unspecified protein-calorie malnutrition: Secondary | ICD-10-CM | POA: Diagnosis not present

## 2020-09-18 DIAGNOSIS — N179 Acute kidney failure, unspecified: Secondary | ICD-10-CM | POA: Diagnosis not present

## 2020-09-18 LAB — PROTEIN ELECTROPHORESIS, SERUM
A/G Ratio: 0.8 (ref 0.7–1.7)
Albumin ELP: 2.8 g/dL — ABNORMAL LOW (ref 2.9–4.4)
Alpha-1-Globulin: 0.2 g/dL (ref 0.0–0.4)
Alpha-2-Globulin: 1.2 g/dL — ABNORMAL HIGH (ref 0.4–1.0)
Beta Globulin: 0.9 g/dL (ref 0.7–1.3)
Gamma Globulin: 1.2 g/dL (ref 0.4–1.8)
Globulin, Total: 3.5 g/dL (ref 2.2–3.9)
Total Protein ELP: 6.3 g/dL (ref 6.0–8.5)

## 2020-09-18 LAB — BASIC METABOLIC PANEL
Anion gap: 11 (ref 5–15)
BUN: 57 mg/dL — ABNORMAL HIGH (ref 8–23)
CO2: 19 mmol/L — ABNORMAL LOW (ref 22–32)
Calcium: 9.6 mg/dL (ref 8.9–10.3)
Chloride: 109 mmol/L (ref 98–111)
Creatinine, Ser: 2.62 mg/dL — ABNORMAL HIGH (ref 0.44–1.00)
GFR, Estimated: 16 mL/min — ABNORMAL LOW (ref >60)
Glucose, Bld: 85 mg/dL (ref 70–99)
Potassium: 3.7 mmol/L (ref 3.5–5.1)
Sodium: 139 mmol/L (ref 135–145)

## 2020-09-18 LAB — HEMOGLOBIN AND HEMATOCRIT, BLOOD
HCT: 32.3 % — ABNORMAL LOW (ref 36.0–46.0)
Hemoglobin: 10.2 g/dL — ABNORMAL LOW (ref 12.0–15.0)

## 2020-09-18 MED ORDER — LEVOTHYROXINE SODIUM 25 MCG PO TABS
25.0000 ug | ORAL_TABLET | Freq: Every day | ORAL | Status: DC
  Administered 2020-09-18 – 2020-09-21 (×4): 25 ug via ORAL
  Filled 2020-09-18 (×4): qty 1

## 2020-09-18 NOTE — Progress Notes (Signed)
PROGRESS NOTE    Rachel Ho  ZWC:[id number] DOB: 1935/01/05 DOA: 09/14/2020 PCP: Ronita Hipps, MD   Brief Narrative:  HPI On 09/15/2020 by Dr. Rupert Stacks Rachel Ho is a 85 y.o. female with medical history significant for asthma and hypertension presents per PCP for concerns of abnormal lab.   She endorses poor po intake since November 2020 due to her salt was taken away. She endorses baseline generalized weakness. She denies urinary/bowel changes.   She denies chest pain, shortness of breath, abdominal pain, diarrhea, constipation, nausea, vomiting, vision changes, headache, fever, chills, cough beyond baseline Am cough.   She states food still tastes good to her, only that since salt is taken way, food does not taste good anymore.   She lives at home by herself.  Interim history Patient found to have AKI and placed on IV fluids.  Nephrology consulted and appreciated. Assessment & Plan   Acute kidney injury -Unclear etiology, possibly secondary to poor oral intake in conjunction with medications -Patient's creatinine level was approximately 0.9 back in June 2021 -Patient stated that since her salt intake has been reduced, she has had very poor appetite -Patient currently on IV fluids -Renal ultrasound showed bilateral echogenic parenchyma suggestive of chronic renal disease and otherwise grossly unremarkable renal ultrasound  -Nephrology consulted and appreciated -Creatinine on admission was 4.04 however increased to 4.14.  Today down to 2.62 -Continue to monitor BMP  Essential hypertension -Home medications of olmesartan, Lasix held -Continue metoprolol as well as hydralazine  Malnutrition/Underweight -Dietitian consulted -Recommended feeding supplementation -Continue to monitor daily weights as well as intake and output -10% of meal documented on 09/17/20  Asthma -Currently no wheezing, continue albuterol as needed  GERD -Continue PPI  Physical  deconditioning  -PT recommended HH vs SNF pending patient's progress -OT recommending SNF -Placement pending further discussions with family, TOC aware  Abnormal thyroid function- hypothyroidism -TSH 232.638, FT4 0.3 -will order low dose synthroid -Patient will need repeat thyroid function studies in 4 to 6 weeks  DVT Prophylaxis Heparin  Code Status: Full  Family Communication: None at bedside  Disposition Plan:  Status is: Inpatient  Remains inpatient appropriate because:IV treatments appropriate due to intensity of illness or inability to take PO and Inpatient level of care appropriate due to severity of illness.  Pending improvement in creatinine.   Dispo: The patient is from: Home              Anticipated d/c is to: TBD              Anticipated d/c date is: > 3 days              Patient currently is not medically stable to d/c.  Consultants Nephrology   Procedures  Renal US  Antibiotics   Anti-infectives (From admission, onward)   None      Subjective:   Rachel Ho seen and examined today.  Patient feeling very tired this morning.  Has no complaints today.  Denies chest pain, shortness of breath, abdominal pain, nausea vomiting, diarrhea constipation, dizziness or headache.   Objective:   Vitals:   09/17/20 2031 09/18/20 0030 09/18/20 0603 09/18/20 0955  BP: (!) 149/62 (!) 169/72 (!) 151/131 (!) 148/63  Pulse: 69 (!) 59 66 64  Resp:  16 16   Temp:  97.6 F (36.4 C) 97.9 F (36.6 C)   TempSrc:  Oral Oral   SpO2: 100% 100% 98% 95%  Weight:      Height:  Intake/Output Summary (Last 24 hours) at 09/18/2020 1030 Last data filed at 09/18/2020 0659 Gross per 24 hour  Intake 1447.74 ml  Output 902 ml  Net 545.74 ml   Filed Weights   09/14/20 1812 09/16/20 0540 09/17/20 0506  Weight: 54.4 kg 45.6 kg 47.6 kg   Exam  General: Well developed, thin, frail, elderly, NAD  HEENT: NCAT, mucous membranes moist.   Cardiovascular: S1 S2  auscultated, RRR  Respiratory: Clear to auscultation bilaterally, no wheezing  Abdomen: Soft, nontender, nondistended, + bowel sounds  Extremities: warm dry without cyanosis clubbing or edema  Neuro: AAOx3, nonfocal  Psych: appropriate mood and affect pleasant  Data Reviewed: I have personally reviewed following labs and imaging studies  CBC: Recent Labs  Lab 09/14/20 1823 09/15/20 0118 09/16/20 0822 09/18/20 0259  WBC 7.7 6.9 5.8  --   NEUTROABS  --   --  3.9  --   HGB 12.1 12.7 10.6* 10.2*  HCT 37.9 39.8 33.2* 32.3*  MCV 82.0 81.9 83.6  --   PLT 178 183 144*  --    Basic Metabolic Panel: Recent Labs  Lab 09/14/20 1823 09/14/20 1823 09/15/20 0118 09/15/20 0819 09/16/20 0822 09/16/20 1420 09/17/20 0035 09/18/20 0259  NA 137  --   --  142 141  --  143 139  K 5.0  --   --  4.6 3.7  --  4.1 3.7  CL 109  --   --  113* 115*  --  116* 109  CO2 13*  --   --  19* 17*  --  18* 19*  GLUCOSE 130*  --   --  195* 107*  --  77 85  BUN 116*  --   --  112* 87*  --  79* 57*  CREATININE 4.04*   < > 4.14* 4.13* 3.36*  --  3.12* 2.62*  CALCIUM 12.0*   < >  --  11.4* 10.0 9.3 9.7 9.6  MG  --   --   --   --  2.3  --   --   --   PHOS  --   --   --   --   --  2.4*  --   --    < > = values in this interval not displayed.   GFR: Estimated Creatinine Clearance: 12.2 mL/min (A) (by C-G formula based on SCr of 2.62 mg/dL (H)). Liver Function Tests: Recent Labs  Lab 09/17/20 0035  AST 87*  ALT 42  ALKPHOS 52  BILITOT 0.2*  PROT 5.9*  ALBUMIN 2.5*   No results for input(s): LIPASE, AMYLASE in the last 168 hours. Recent Labs  Lab 09/16/20 0822  AMMONIA 32   Coagulation Profile: No results for input(s): INR, PROTIME in the last 168 hours. Cardiac Enzymes: No results for input(s): CKTOTAL, CKMB, CKMBINDEX, TROPONINI in the last 168 hours. BNP (last 3 results) No results for input(s): PROBNP in the last 8760 hours. HbA1C: No results for input(s): HGBA1C in the last 72  hours. CBG: No results for input(s): GLUCAP in the last 168 hours. Lipid Profile: No results for input(s): CHOL, HDL, LDLCALC, TRIG, CHOLHDL, LDLDIRECT in the last 72 hours. Thyroid Function Tests: Recent Labs    09/16/20 0822 09/17/20 1108  TSH 232.638*  --   FREET4  --  0.30*   Anemia Panel: Recent Labs    09/16/20 0822  VITAMINB12 6,473*  FOLATE 22.8   Urine analysis:    Component Value Date/Time  COLORURINE YELLOW 09/14/2020 2241   APPEARANCEUR HAZY (A) 09/14/2020 2241   LABSPEC 1.011 09/14/2020 2241   PHURINE 5.0 09/14/2020 2241   GLUCOSEU NEGATIVE 09/14/2020 2241   HGBUR MODERATE (A) 09/14/2020 2241   BILIRUBINUR NEGATIVE 09/14/2020 [place] 09/14/2020 2241   PROTEINUR 30 (A) 09/14/2020 2241   NITRITE NEGATIVE 09/14/2020 2241   LEUKOCYTESUR SMALL (A) 09/14/2020 2241   Sepsis Labs: @LABRCNTIP (procalcitonin:4,lacticidven:4)  ) Recent Results (from the past 240 hour(s))  Respiratory Panel by RT PCR (Flu A&B, Covid) - Nasopharyngeal Swab     Status: None   Collection Time: 09/15/20  2:36 AM   Specimen: Nasopharyngeal Swab  Result Value Ref Range Status   SARS Coronavirus 2 by RT PCR NEGATIVE NEGATIVE Final    Comment: (NOTE) SARS-CoV-2 target nucleic acids are NOT DETECTED.  The SARS-CoV-2 RNA is generally detectable in upper respiratoy specimens during the acute phase of infection. The lowest concentration of SARS-CoV-2 viral copies this assay can detect is 131 copies/mL. A negative result does not preclude SARS-Cov-2 infection and should not be used as the sole basis for treatment or other patient management decisions. A negative result may occur with  improper specimen collection/handling, submission of specimen other than nasopharyngeal swab, presence of viral mutation(s) within the areas targeted by this assay, and inadequate number of viral copies (<131 copies/mL). A negative result must be combined with clinical observations,  patient history, and epidemiological information. The expected result is Negative.  Fact Sheet for Patients:  PinkCheek.be  Fact Sheet for Healthcare Providers:  GravelBags.it  This test is no t yet approved or cleared by the [place] FDA and  has been authorized for detection and/or diagnosis of SARS-CoV-2 by FDA under an Emergency Use Authorization (EUA). This EUA will remain  in effect (meaning this test can be used) for the duration of the COVID-19 declaration under Section 564(b)(1) of the Act, 21 U.S.C. section 360bbb-3(b)(1), unless the authorization is terminated or revoked sooner.     Influenza A by PCR NEGATIVE NEGATIVE Final   Influenza B by PCR NEGATIVE NEGATIVE Final    Comment: (NOTE) The Xpert Xpress SARS-CoV-2/FLU/RSV assay is intended as an aid in  the diagnosis of influenza from Nasopharyngeal swab specimens and  should not be used as a sole basis for treatment. Nasal washings and  aspirates are unacceptable for Xpert Xpress SARS-CoV-2/FLU/RSV  testing.  Fact Sheet for Patients: PinkCheek.be  Fact Sheet for Healthcare Providers: GravelBags.it  This test is not yet approved or cleared by the [place] FDA and  has been authorized for detection and/or diagnosis of SARS-CoV-2 by  FDA under an Emergency Use Authorization (EUA). This EUA will remain  in effect (meaning this test can be used) for the duration of the  Covid-19 declaration under Section 564(b)(1) of the Act, 21  U.S.C. section 360bbb-3(b)(1), unless the authorization is  terminated or revoked. Performed at [facility], [place] [address], [place], [place] [address]       Radiology Studies: No results found.   Scheduled Meds: . aspirin EC  81 mg Oral Daily  . feeding supplement  237 mL Oral BID BM  . heparin  5,000 Units Subcutaneous Q8H  . metoprolol tartrate  50  mg Oral BID  . pantoprazole  40 mg Oral Daily   Continuous Infusions: . lactated ringers 75 mL/hr at 09/17/20 1947     LOS: 3 days   Time Spent in minutes   30 minutes  Nicholette Dolson  Ghalia Reicks D.O. on 09/18/2020 at 10:30 AM  Between 7am to 7pm - Please see pager noted on [email or web address]  After 7pm go to [email or web address]  And look for the night coverage person covering for me after hours  [facility] Office  [phone]

## 2020-09-18 NOTE — Progress Notes (Signed)
Pt c/o pain on right upper arm. Accessed site, pt's arm had been resting against tele box. Pt had bruising in this area already. Small skin tear noticed in that area. Foam dressing placed.

## 2020-09-18 NOTE — TOC Progression Note (Addendum)
Transition of Care [facility]) - Progression Note    Patient Details  Name: Rachel Ho MRN: [id number] Date of Birth: Jun 15, 1935  Transition of Care [facility]) CM/SW [place], [place] Phone Number: 09/18/2020, 1:41 PM  Clinical Narrative:    CSW called pt son Rachel Ho; left voicemail requesting return call  1342: CSW called; no answer  1427: pt son, Rachel Ho [phone] called CSW. He explained that his siblings are wanting to coordinate a way to manage pts care at home but are not certain how that will look yet. He explains the 6 adult children plan to meet on 10/17 to discuss plans. He explains that his sister Rachel Ho [phone] knows a homecare agency that would provide 16 hours pcs and pt kids and grandkids will supervise other 8 hours.  CSW calls pt daughter Rachel Ho and confirms family is going through PCP to get pcs with [facility] in [place]. CSW also informs HH would be covered by medicare.   Expected Discharge Plan: [place] Barriers to Discharge: Continued Medical Work up  Expected Discharge Plan and Services Expected Discharge Plan: [place] Choice: [place] arrangements for the past 2 months: Single Family Home                                       Social Determinants of Health (SDOH) Interventions    Readmission Risk Interventions No flowsheet data found.

## 2020-09-18 NOTE — Progress Notes (Signed)
[facility] Progress Note   Subjective:   Patient seen and evaluated at bedside. She continues to feel better with each day. Her renal function is improving, Cr down to 2.62 today. Improving UOP. BP has been elevated the past day. On Metoprolol and prn Hydralazine.  Denies CP, HA, SOB, n/v/d.  Objective Vitals:   09/18/20 0030 09/18/20 0603 09/18/20 0955 09/18/20 1230  BP: (!) 169/72 (!) 151/131 (!) 148/63 (!) 142/75  Pulse: (!) 59 66 64 (!) 56  Resp: 16 16  16   Temp: 97.6 F (36.4 C) 97.9 F (36.6 C)  98.7 F (37.1 C)  TempSrc: Oral Oral  Oral  SpO2: 100% 98% 95% 100%  Weight:      Height:       Physical Exam General:Elderly, frail African-American female lying down in bed in NAD. Head:NCAT. Sclera not icteric. Moist mucus membranes. Has temporal wasting Neck: Supple. No lymphadenopathy Lungs: CTA bilaterally, anteriorly. No wheeze, rales or rhonchi. Breathing is unlabored. Heart:RRR. No murmur, rubs or gallops.  Abdomen: Soft, flat, and nontender, +BS, no guarding, no rebound tenderness.  Lower extremities:Trace edema in bilateral lower extremities. No ischemic changes or open wounds  Neuro: AAOx2. Moves all extremities spontaneously. Psych: Appropriate affect and mood. Responds to questions inappropriately at times.   Filed Weights   09/14/20 1812 09/16/20 0540 09/17/20 0506  Weight: 54.4 kg 45.6 kg 47.6 kg    Intake/Output Summary (Last 24 hours) at 09/18/2020 1259 Last data filed at 09/18/2020 0659 Gross per 24 hour  Intake 1222.84 ml  Output 702 ml  Net 520.84 ml    Additional Objective Labs: Basic Metabolic Panel: Recent Labs  Lab 09/16/20 0822 09/16/20 1420 09/17/20 0035 09/18/20 0259  NA 141  --  143 139  K 3.7  --  4.1 3.7  CL 115*  --  116* 109  CO2 17*  --  18* 19*  GLUCOSE 107*  --  77 85  BUN 87*  --  79* 57*  CREATININE 3.36*  --  3.12* 2.62*  CALCIUM 10.0 9.3 9.7 9.6  PHOS  --  2.4*  --   --    Liver Function  Tests: Recent Labs  Lab 09/17/20 0035  AST 87*  ALT 42  ALKPHOS 52  BILITOT 0.2*  PROT 5.9*  ALBUMIN 2.5*   CBC: Recent Labs  Lab 09/14/20 1823 09/14/20 1823 09/15/20 0118 09/16/20 0822 09/18/20 0259  WBC 7.7  --  6.9 5.8  --   NEUTROABS  --   --   --  3.9  --   HGB 12.1   < > 12.7 10.6* 10.2*  HCT 37.9   < > 39.8 33.2* 32.3*  MCV 82.0  --  81.9 83.6  --   PLT 178  --  183 144*  --    < > = values in this interval not displayed.   Medications: . lactated ringers 75 mL/hr at 09/17/20 1947   . aspirin EC  81 mg Oral Daily  . feeding supplement  237 mL Oral BID BM  . heparin  5,000 Units Subcutaneous Q8H  . levothyroxine  25 mcg Oral Q0600  . metoprolol tartrate  50 mg Oral BID  . pantoprazole  40 mg Oral Daily    Dialysis Orders:  Assessment/Plan: 89F with AKI with baseline Cr 0.9 in June 2021; UA w/o features of sig glomerular disease; and unintentional weight loss / FTT. Was hyercalcemic on admission. Since admit hydrated with NS, held diuretic/ARB. SCr  and BUN improved from admission. I suspect has a hypovolemic component but also some degree of CKD. Myeloma eval sent. Hold ARB and NSAIDs.  1. AKI -Improving with hydration and holding ARB. May stop LR now that patient is increasing PO intake. Encourage adequate oral hydration. Continue to monitor RFP daily. Imaging w/o acute structural issues but a degree of medical renal disease. 2. Hypercalcemia mild/moderate - improving. Calcium 9.6 today. sFLC elevated consistent with low GFR. Pending SPEP. No sig cytopenias. PTH  - WNL.Phosphorous low at 2.5 - Improving. Gamma gap 3.4 - not elevated. 3. HTN - BP elevated, continue Metoprolol and PRN Hydralazine. Consider adding Amlodipine as needed. Continue holding ARB and Lasix.  Would not restart ARB.  4. Elevated TSH- newly discovered this admission. Started on synthroid. Management per primary. 5. COPD 6. FTT, unintentional weight loss - RD following. Pt likes food  here, continue to optimize nutrition. 7. ? AAA  Rexene Agent  09/18/2020,12:59 PM  LOS: 3 days

## 2020-09-19 DIAGNOSIS — E46 Unspecified protein-calorie malnutrition: Secondary | ICD-10-CM | POA: Diagnosis not present

## 2020-09-19 DIAGNOSIS — I10 Essential (primary) hypertension: Secondary | ICD-10-CM | POA: Diagnosis not present

## 2020-09-19 DIAGNOSIS — N179 Acute kidney failure, unspecified: Secondary | ICD-10-CM | POA: Diagnosis not present

## 2020-09-19 LAB — RENAL FUNCTION PANEL
Albumin: 2.3 g/dL — ABNORMAL LOW (ref 3.5–5.0)
Anion gap: 11 (ref 5–15)
BUN: 43 mg/dL — ABNORMAL HIGH (ref 8–23)
CO2: 20 mmol/L — ABNORMAL LOW (ref 22–32)
Calcium: 9.3 mg/dL (ref 8.9–10.3)
Chloride: 110 mmol/L (ref 98–111)
Creatinine, Ser: 2.32 mg/dL — ABNORMAL HIGH (ref 0.44–1.00)
GFR, Estimated: 19 mL/min — ABNORMAL LOW (ref >60)
Glucose, Bld: 93 mg/dL (ref 70–99)
Phosphorus: 1.3 mg/dL — ABNORMAL LOW (ref 2.5–4.6)
Potassium: 3.4 mmol/L — ABNORMAL LOW (ref 3.5–5.1)
Sodium: 141 mmol/L (ref 135–145)

## 2020-09-19 LAB — MAGNESIUM: Magnesium: 1.8 mg/dL (ref 1.7–2.4)

## 2020-09-19 MED ORDER — POTASSIUM CHLORIDE CRYS ER 20 MEQ PO TBCR
40.0000 meq | EXTENDED_RELEASE_TABLET | Freq: Once | ORAL | Status: AC
  Administered 2020-09-19: 40 meq via ORAL
  Filled 2020-09-19: qty 2

## 2020-09-19 NOTE — Progress Notes (Signed)
Received  Call from central tele stating patient noted to have ST depression in V lead of 2.3 and in MCL of 2.6. Pt in no acute distress. VSS (see flowsheet). Denies any pain or discomfort at this time. Is currently receiving prn albuterol neb for mild sob after being up to bathroom without her O2 on. Attending notified via chat. Pending response.

## 2020-09-19 NOTE — Progress Notes (Signed)
PROGRESS NOTE    Rachel Ho  UDJ:[id number] DOB: 1935-11-18 DOA: 09/14/2020 PCP: Ronita Hipps, MD   Brief Narrative:  HPI On 09/15/2020 by Dr. Rupert Stacks Rachel Ho is a 85 y.o. female with medical history significant for asthma and hypertension presents per PCP for concerns of abnormal lab.   She endorses poor po intake since November 2020 due to her salt was taken away. She endorses baseline generalized weakness. She denies urinary/bowel changes.   She denies chest pain, shortness of breath, abdominal pain, diarrhea, constipation, nausea, vomiting, vision changes, headache, fever, chills, cough beyond baseline Am cough.   She states food still tastes good to her, only that since salt is taken way, food does not taste good anymore.   She lives at home by herself.  Interim history Patient found to have AKI and placed on IV fluids.  Nephrology consulted and appreciated. Assessment & Plan   Acute kidney injury -Unclear etiology, possibly secondary to poor oral intake in conjunction with medications -Patient's creatinine level was approximately 0.9 back in June 2021 -Patient stated that since her salt intake has been reduced, she has had very poor appetite -Patient currently on IV fluids-encouraging oral intake -Renal ultrasound showed bilateral echogenic parenchyma suggestive of chronic renal disease and otherwise grossly unremarkable renal ultrasound  -SPEP negative -Nephrology consulted and appreciated -Creatinine on admission was 4.04 however increased to 4.14.  Today down to 2.32 -Continue to monitor BMP  Essential hypertension -Home medications of olmesartan, Lasix held due AKI -BP stable -Continue metoprolol as well as hydralazine  Malnutrition/Underweight -Dietitian consulted -Recommended feeding supplementation -Continue to monitor daily weights as well as intake and output -10% of meal documented on 09/17/20; 20% documented on  09/18/2020  Asthma -Currently no wheezing, continue albuterol as needed  GERD -Continue PPI   Physical deconditioning  -PT recommended HH vs SNF pending patient's progress -OT recommending SNF -Placement pending further discussions with family, TOC aware  Abnormal thyroid function- hypothyroidism -TSH 232.638, FT4 0.3 -Placed on low-dose Synthroid.  Patient may need increased dose however given her age would start at a lower dose -Patient will need repeat thyroid function studies in 4 to 6 weeks  Hypokalemia -Replacing continue to monitor BMP -pending magnesium  ?ST Depression noted on tele monitoring -No complaints of chest pain -Will obtain 12 lead EKG -Continue to replace potassium, pending magnesium  DVT Prophylaxis Heparin  Code Status: Full  Family Communication: None at bedside  Disposition Plan:  Status is: Inpatient  Remains inpatient appropriate because:IV treatments appropriate due to intensity of illness or inability to take PO and Inpatient level of care appropriate due to severity of illness.  Pending improvement in creatinine.   Dispo: The patient is from: Home              Anticipated d/c is to: TBD              Anticipated d/c date is: > 3 days              Patient currently is not medically stable to d/c.  Consultants Nephrology   Procedures  Renal US  Antibiotics   Anti-infectives (From admission, onward)   None      Subjective:   Townsend Roger seen and examined today.  Patient with no complaints this morning.  Denies chest pain, shortness breath, abdominal pain, nausea or vomiting, diarrhea constipation, dizziness or headache.  Is thankful that she is getting better.   Objective:   Vitals:  09/19/20 0506 09/19/20 0630 09/19/20 0933 09/19/20 1208  BP: (!) 161/73  135/60 (!) 137/56  Pulse: 70  78 64  Resp: 16  16 18   Temp: 98 F (36.7 C)  98.1 F (36.7 C) 98.7 F (37.1 C)  TempSrc: Oral  Oral Oral  SpO2: 99%  97% 100%   Weight:  48.8 kg    Height:        Intake/Output Summary (Last 24 hours) at 09/19/2020 1332 Last data filed at 09/19/2020 0600 Gross per 24 hour  Intake 360 ml  Output 600 ml  Net -240 ml   Filed Weights   09/16/20 0540 09/17/20 0506 09/19/20 0630  Weight: 45.6 kg 47.6 kg 48.8 kg   Exam  General: Well developed, thin, frail, elderly, NAD  HEENT: NCAT, mucous membranes moist.   Cardiovascular: S1 S2 auscultated, no rubs, murmurs or gallops. Regular rate and rhythm.  Respiratory: Clear to auscultation bilaterally  Abdomen: Soft, nontender, nondistended, + bowel sounds  Extremities: warm dry without cyanosis clubbing or edema  Neuro: AAOx3, nonfocal  Psych: pleasant, appropriate mood and affect   Data Reviewed: I have personally reviewed following labs and imaging studies  CBC: Recent Labs  Lab 09/14/20 1823 09/15/20 0118 09/16/20 0822 09/18/20 0259  WBC 7.7 6.9 5.8  --   NEUTROABS  --   --  3.9  --   HGB 12.1 12.7 10.6* 10.2*  HCT 37.9 39.8 33.2* 32.3*  MCV 82.0 81.9 83.6  --   PLT 178 183 144*  --    Basic Metabolic Panel: Recent Labs  Lab 09/15/20 0819 09/15/20 0819 09/16/20 0822 09/16/20 1420 09/17/20 0035 09/18/20 0259 09/19/20 0354  NA 142  --  141  --  143 139 141  K 4.6  --  3.7  --  4.1 3.7 3.4*  CL 113*  --  115*  --  116* 109 110  CO2 19*  --  17*  --  18* 19* 20*  GLUCOSE 195*  --  107*  --  77 85 93  BUN 112*  --  87*  --  79* 57* 43*  CREATININE 4.13*  --  3.36*  --  3.12* 2.62* 2.32*  CALCIUM 11.4*   < > 10.0 9.3 9.7 9.6 9.3  MG  --   --  2.3  --   --   --   --   PHOS  --   --   --  2.4*  --   --  1.3*   < > = values in this interval not displayed.   GFR: Estimated Creatinine Clearance: 14.2 mL/min (A) (by C-G formula based on SCr of 2.32 mg/dL (H)). Liver Function Tests: Recent Labs  Lab 09/17/20 0035 09/19/20 0354  AST 87*  --   ALT 42  --   ALKPHOS 52  --   BILITOT 0.2*  --   PROT 5.9*  --   ALBUMIN 2.5* 2.3*   No  results for input(s): LIPASE, AMYLASE in the last 168 hours. Recent Labs  Lab 09/16/20 0822  AMMONIA 32   Coagulation Profile: No results for input(s): INR, PROTIME in the last 168 hours. Cardiac Enzymes: No results for input(s): CKTOTAL, CKMB, CKMBINDEX, TROPONINI in the last 168 hours. BNP (last 3 results) No results for input(s): PROBNP in the last 8760 hours. HbA1C: No results for input(s): HGBA1C in the last 72 hours. CBG: No results for input(s): GLUCAP in the last 168 hours. Lipid Profile: No results for input(s): CHOL,  HDL, LDLCALC, TRIG, CHOLHDL, LDLDIRECT in the last 72 hours. Thyroid Function Tests: Recent Labs    09/17/20 1108  FREET4 0.30*   Anemia Panel: No results for input(s): VITAMINB12, FOLATE, FERRITIN, TIBC, IRON, RETICCTPCT in the last 72 hours. Urine analysis:    Component Value Date/Time   COLORURINE YELLOW 09/14/2020 2241   APPEARANCEUR HAZY (A) 09/14/2020 2241   LABSPEC 1.011 09/14/2020 2241   PHURINE 5.0 09/14/2020 2241   GLUCOSEU NEGATIVE 09/14/2020 2241   HGBUR MODERATE (A) 09/14/2020 2241   BILIRUBINUR NEGATIVE 09/14/2020 [place] 09/14/2020 2241   PROTEINUR 30 (A) 09/14/2020 2241   NITRITE NEGATIVE 09/14/2020 2241   LEUKOCYTESUR SMALL (A) 09/14/2020 2241   Sepsis Labs: @LABRCNTIP (procalcitonin:4,lacticidven:4)  ) Recent Results (from the past 240 hour(s))  Respiratory Panel by RT PCR (Flu A&B, Covid) - Nasopharyngeal Swab     Status: None   Collection Time: 09/15/20  2:36 AM   Specimen: Nasopharyngeal Swab  Result Value Ref Range Status   SARS Coronavirus 2 by RT PCR NEGATIVE NEGATIVE Final    Comment: (NOTE) SARS-CoV-2 target nucleic acids are NOT DETECTED.  The SARS-CoV-2 RNA is generally detectable in upper respiratoy specimens during the acute phase of infection. The lowest concentration of SARS-CoV-2 viral copies this assay can detect is 131 copies/mL. A negative result does not preclude SARS-Cov-2 infection  and should not be used as the sole basis for treatment or other patient management decisions. A negative result may occur with  improper specimen collection/handling, submission of specimen other than nasopharyngeal swab, presence of viral mutation(s) within the areas targeted by this assay, and inadequate number of viral copies (<131 copies/mL). A negative result must be combined with clinical observations, patient history, and epidemiological information. The expected result is Negative.  Fact Sheet for Patients:  PinkCheek.be  Fact Sheet for Healthcare Providers:  GravelBags.it  This test is no t yet approved or cleared by the [place] FDA and  has been authorized for detection and/or diagnosis of SARS-CoV-2 by FDA under an Emergency Use Authorization (EUA). This EUA will remain  in effect (meaning this test can be used) for the duration of the COVID-19 declaration under Section 564(b)(1) of the Act, 21 U.S.C. section 360bbb-3(b)(1), unless the authorization is terminated or revoked sooner.     Influenza A by PCR NEGATIVE NEGATIVE Final   Influenza B by PCR NEGATIVE NEGATIVE Final    Comment: (NOTE) The Xpert Xpress SARS-CoV-2/FLU/RSV assay is intended as an aid in  the diagnosis of influenza from Nasopharyngeal swab specimens and  should not be used as a sole basis for treatment. Nasal washings and  aspirates are unacceptable for Xpert Xpress SARS-CoV-2/FLU/RSV  testing.  Fact Sheet for Patients: PinkCheek.be  Fact Sheet for Healthcare Providers: GravelBags.it  This test is not yet approved or cleared by the [place] FDA and  has been authorized for detection and/or diagnosis of SARS-CoV-2 by  FDA under an Emergency Use Authorization (EUA). This EUA will remain  in effect (meaning this test can be used) for the duration of the  Covid-19 declaration  under Section 564(b)(1) of the Act, 21  U.S.C. section 360bbb-3(b)(1), unless the authorization is  terminated or revoked. Performed at [facility], [place] [address] [place], [place] [address]       Radiology Studies: No results found.   Scheduled Meds: . aspirin EC  81 mg Oral Daily  . feeding supplement  237 mL Oral BID BM  . heparin  5,000 Units Subcutaneous Q8H  . levothyroxine  25 mcg Oral Q0600  . metoprolol tartrate  50 mg Oral BID  . pantoprazole  40 mg Oral Daily   Continuous Infusions:    LOS: 4 days   Time Spent in minutes   30 minutes  Tanyon Alipio D.O. on 09/19/2020 at 1:32 PM  Between 7am to 7pm - Please see pager noted on [email or web address]  After 7pm go to [email or web address]  And look for the night coverage person covering for me after hours  [facility] Office  [phone]

## 2020-09-19 NOTE — Progress Notes (Signed)
[facility] Progress Note   Subjective:     No complaints  SPEP negative  Creatinine improved to 2.3, K3.4  Objective Vitals:   09/19/20 0028 09/19/20 0506 09/19/20 0630 09/19/20 0933  BP: (!) 167/83 (!) 161/73  135/60  Pulse: 64 70  78  Resp: 15 16  16   Temp: 98 F (36.7 C) 98 F (36.7 C)  98.1 F (36.7 C)  TempSrc: Oral Oral  Oral  SpO2: 98% 99%  97%  Weight:   48.8 kg   Height:       Physical Exam General:Elderly, female lying down in bed in NAD. Head:NCAT. Sclera not icteric. Moist mucus membranes. Has temporal wasting Neck: Supple. No lymphadenopathy Lungs: CTA bilaterally, anteriorly. No wheeze, rales or rhonchi. Breathing is unlabored. Heart:RRR. No murmur, rubs or gallops.  Abdomen: Soft, flat, and nontender, +BS, no guarding, no rebound tenderness.  Lower extremities:Trace edema in bilateral lower extremities. No ischemic changes or open wounds  Neuro: AAOx2. Moves all extremities spontaneously. Psych: Appropriate affect and mood. Responds to questions inappropriately at times.   Filed Weights   09/16/20 0540 09/17/20 0506 09/19/20 0630  Weight: 45.6 kg 47.6 kg 48.8 kg    Intake/Output Summary (Last 24 hours) at 09/19/2020 1149 Last data filed at 09/19/2020 0600 Gross per 24 hour  Intake 600 ml  Output 600 ml  Net 0 ml    Additional Objective Labs: Basic Metabolic Panel: Recent Labs  Lab 09/16/20 0822 09/16/20 1420 09/17/20 0035 09/18/20 0259 09/19/20 0354  NA   < >  --  143 139 141  K   < >  --  4.1 3.7 3.4*  CL   < >  --  116* 109 110  CO2   < >  --  18* 19* 20*  GLUCOSE   < >  --  77 85 93  BUN   < >  --  79* 57* 43*  CREATININE   < >  --  3.12* 2.62* 2.32*  CALCIUM   < > 9.3 9.7 9.6 9.3  PHOS  --  2.4*  --   --  1.3*   < > = values in this interval not displayed.   Liver Function Tests: Recent Labs  Lab 09/17/20 0035 09/19/20 0354  AST 87*  --   ALT 42  --   ALKPHOS 52  --   BILITOT 0.2*  --   PROT  5.9*  --   ALBUMIN 2.5* 2.3*   CBC: Recent Labs  Lab 09/14/20 1823 09/14/20 1823 09/15/20 0118 09/16/20 0822 09/18/20 0259  WBC 7.7  --  6.9 5.8  --   NEUTROABS  --   --   --  3.9  --   HGB 12.1   < > 12.7 10.6* 10.2*  HCT 37.9   < > 39.8 33.2* 32.3*  MCV 82.0  --  81.9 83.6  --   PLT 178  --  183 144*  --    < > = values in this interval not displayed.   Medications: . lactated ringers 75 mL/hr at 09/18/20 2112   . aspirin EC  81 mg Oral Daily  . feeding supplement  237 mL Oral BID BM  . heparin  5,000 Units Subcutaneous Q8H  . levothyroxine  25 mcg Oral Q0600  . metoprolol tartrate  50 mg Oral BID  . pantoprazole  40 mg Oral Daily    Dialysis Orders:  Assessment/Plan: 40F with AKI with baseline Cr 0.9  in June 2021; UA w/o features of sig glomerular disease; and unintentional weight loss / FTT. Was hyercalcemic on admission. Since admit hydrated with NS, held diuretic/ARB. With hydration, holding ARB, SCr and BUN improved from admission. I suspect has a hypovolemic component but also some degree of CKD.  SPEP and serum free light chains negative.   1. AKI -was hypovolemia + ARB etiology.  Resolving.  Stop IV fluids, push oral hydration 2. Hypercalcemia mild/moderate -resolved, likely due to immobility.  SPEP and sFLC negative. PTH   not elevated. PTH RP is pending 3. HTN - BP labile, continue Metoprolol and PRN Hydralazine. Consider adding Amlodipine as needed. Continue holding ARB and Lasix.  Would not restart ARB.  4. Hypothyroidism- newly discovered this admission. Started on synthroid. Management per primary. 5. COPD 6. FTT, unintentional weight loss - RD following. Pt likes food here, continue to optimize nutrition. 7. ? AAA  No further suggestions. Will sign off for now.  Please call with any questions or concerns.  Pt does not need follow up with nephrology and can have labs monitored by PCP with outpatient referral as necessary.  Monic Engelmann B Jhoselin Crume   09/19/2020,11:49 AM  LOS: 4 days

## 2020-09-19 NOTE — Progress Notes (Signed)
Pt has been quiet, soft spoken throughout the day which is her baseline. Uses call bell appropriately for her needs. No attempts to get out of bed on her own. Uses bsc with assist x 1. All meds taken whole without difficulty. Appetite fair, drinking half her ensure. States "its too much to drink all of it". This morning pt has episode of mild sob after being up without her oxygen on. Albuterol neb given at that time with good results. Pt reported feeling "better" after administration. Spoke with Dr. Ree Kida via phone regarding ST depression per central tele monitoring. EKG ordered and done. Labs ordered for a.m. No further sob episodes throughout remainder of shift. No further calls from central telemetry. Pt denied any pain and/or discomfort throughout shift.

## 2020-09-20 DIAGNOSIS — I10 Essential (primary) hypertension: Secondary | ICD-10-CM | POA: Diagnosis not present

## 2020-09-20 DIAGNOSIS — N179 Acute kidney failure, unspecified: Secondary | ICD-10-CM | POA: Diagnosis not present

## 2020-09-20 DIAGNOSIS — E46 Unspecified protein-calorie malnutrition: Secondary | ICD-10-CM | POA: Diagnosis not present

## 2020-09-20 LAB — BASIC METABOLIC PANEL
Anion gap: 10 (ref 5–15)
BUN: 44 mg/dL — ABNORMAL HIGH (ref 8–23)
CO2: 23 mmol/L (ref 22–32)
Calcium: 9.4 mg/dL (ref 8.9–10.3)
Chloride: 108 mmol/L (ref 98–111)
Creatinine, Ser: 2.3 mg/dL — ABNORMAL HIGH (ref 0.44–1.00)
GFR, Estimated: 19 mL/min — ABNORMAL LOW (ref >60)
Glucose, Bld: 133 mg/dL — ABNORMAL HIGH (ref 70–99)
Potassium: 3.9 mmol/L (ref 3.5–5.1)
Sodium: 141 mmol/L (ref 135–145)

## 2020-09-20 NOTE — Progress Notes (Signed)
PROGRESS NOTE    Rachel Ho  GEX:[id number] DOB: [birth year] DOA: 09/14/2020 PCP: Ronita Hipps, MD   Brief Narrative:  HPI On 09/15/2020 by Dr. Rupert Stacks Rachel Ho is a 85 y.o. female with medical history significant for asthma and hypertension presents per PCP for concerns of abnormal lab.   She endorses poor po intake since November 2020 due to her salt was taken away. She endorses baseline generalized weakness. She denies urinary/bowel changes.   She denies chest pain, shortness of breath, abdominal pain, diarrhea, constipation, nausea, vomiting, vision changes, headache, fever, chills, cough beyond baseline Am cough.   She states food still tastes good to her, only that since salt is taken way, food does not taste good anymore.   She lives at home by herself.  Interim history Patient found to have AKI and placed on IV fluids.  Nephrology consulted and appreciated. Assessment & Plan   Acute kidney injury -Unclear etiology, possibly secondary to poor oral intake in conjunction with medications -Patient's creatinine level was approximately 0.9 back in June 2021 -Patient stated that since her salt intake has been reduced, she has had very poor appetite -Patient currently on IV fluids-encouraging oral intake -Renal ultrasound showed bilateral echogenic parenchyma suggestive of chronic renal disease and otherwise grossly unremarkable renal ultrasound  -SPEP negative -Nephrology consulted and appreciated -Creatinine on admission was 4.04 however increased to 4.14.  Today down to 2.30 -Patient may now have some component of chronic kidney disease at baseline -Continue to monitor BMP  Essential hypertension -Home medications of olmesartan, Lasix held due AKI -BP stable, currently 119/47 -Continue metoprolol as well as hydralazine  Malnutrition/Underweight -Dietitian consulted -Recommended feeding supplementation -Continue to monitor daily weights as well as intake  and output -10% of meal documented on 09/17/20; 20% documented on 09/18/2020  Asthma -Currently no wheezing, continue albuterol as needed  GERD -Continue PPI   Physical deconditioning  -PT recommended HH vs SNF pending patient's progress -OT recommending SNF -Placement pending further discussions with family, TOC aware  Abnormal thyroid function- hypothyroidism -TSH 232.638, FT4 0.3 -Placed on low-dose Synthroid.  Patient may need increased dose however given her age would start at a lower dose -Patient will need repeat thyroid function studies in 4 to 6 weeks  Hypokalemia -Resolved with replacement. Magnesium 1.8 -Continue to monitor and replace as needed  ?ST Depression noted on tele monitoring -No complaints of chest pain -EKG obtained and unremarkable for any ST depression  DVT Prophylaxis Heparin  Code Status: Full  Family Communication: None at bedside  Disposition Plan:  Status is: Inpatient  Remains inpatient appropriate because:IV treatments appropriate due to intensity of illness or inability to take PO and Inpatient level of care appropriate due to severity of illness.  Pending improvement in creatinine.   Dispo: The patient is from: Home              Anticipated d/c is to: TBD - suspect home with home health              Anticipated d/c date is: 1-2 days              Patient currently is not medically stable to d/c.  Consultants Nephrology   Procedures  Renal US  Antibiotics   Anti-infectives (From admission, onward)   None      Subjective:   Rachel Ho seen and examined today. Patient with no complaints this morning. Denies chest pain, shortness of breath, dental pain, nausea or vomiting, diarrhea  constipation, dizziness or headache. Objective:   Vitals:   09/20/20 0028 09/20/20 0130 09/20/20 0557 09/20/20 1200  BP: (!) 154/86  (!) 152/63 (!) 119/47  Pulse: 98  77 84  Resp: 18  18 18   Temp: 98.1 F (36.7 C)  98.5 F (36.9 C) 98.1  F (36.7 C)  TempSrc: Oral  Oral Oral  SpO2: 100%  100%   Weight:  50 kg    Height:        Intake/Output Summary (Last 24 hours) at 09/20/2020 1308 Last data filed at 09/20/2020 0129 Gross per 24 hour  Intake 240 ml  Output --  Net 240 ml   Filed Weights   09/17/20 0506 09/19/20 0630 09/20/20 0130  Weight: 47.6 kg 48.8 kg 50 kg   Exam  General: Well developed, thin, frail, elderly, NAD  HEENT: NCAT, mucous membranes moist.   Cardiovascular: S1 S2 auscultated, RRR  Respiratory: Clear to auscultation bilaterally no wheezing  Abdomen: Soft, nontender, nondistended, + bowel sounds  Extremities: warm dry without cyanosis clubbing or edema  Neuro: AAOx3, nonfocal  Psych: appropriate mood and affect, pleasant   Data Reviewed: I have personally reviewed following labs and imaging studies  CBC: Recent Labs  Lab 09/14/20 1823 09/15/20 0118 09/16/20 0822 09/18/20 0259  WBC 7.7 6.9 5.8  --   NEUTROABS  --   --  3.9  --   HGB 12.1 12.7 10.6* 10.2*  HCT 37.9 39.8 33.2* 32.3*  MCV 82.0 81.9 83.6  --   PLT 178 183 144*  --    Basic Metabolic Panel: Recent Labs  Lab 09/16/20 0822 09/16/20 1420 09/17/20 0035 09/18/20 0259 09/19/20 0354 09/19/20 1510 09/20/20 0055  NA 141  --  143 139 141  --  141  K 3.7  --  4.1 3.7 3.4*  --  3.9  CL 115*  --  116* 109 110  --  108  CO2 17*  --  18* 19* 20*  --  23  GLUCOSE 107*  --  77 85 93  --  133*  BUN 87*  --  79* 57* 43*  --  44*  CREATININE 3.36*  --  3.12* 2.62* 2.32*  --  2.30*  CALCIUM 10.0 9.3 9.7 9.6 9.3  --  9.4  MG 2.3  --   --   --   --  1.8  --   PHOS  --  2.4*  --   --  1.3*  --   --    GFR: Estimated Creatinine Clearance: 14.6 mL/min (A) (by C-G formula based on SCr of 2.3 mg/dL (H)). Liver Function Tests: Recent Labs  Lab 09/17/20 0035 09/19/20 0354  AST 87*  --   ALT 42  --   ALKPHOS 52  --   BILITOT 0.2*  --   PROT 5.9*  --   ALBUMIN 2.5* 2.3*   No results for input(s): LIPASE, AMYLASE in  the last 168 hours. Recent Labs  Lab 09/16/20 0822  AMMONIA 32   Coagulation Profile: No results for input(s): INR, PROTIME in the last 168 hours. Cardiac Enzymes: No results for input(s): CKTOTAL, CKMB, CKMBINDEX, TROPONINI in the last 168 hours. BNP (last 3 results) No results for input(s): PROBNP in the last 8760 hours. HbA1C: No results for input(s): HGBA1C in the last 72 hours. CBG: No results for input(s): GLUCAP in the last 168 hours. Lipid Profile: No results for input(s): CHOL, HDL, LDLCALC, TRIG, CHOLHDL, LDLDIRECT in the last 72 hours.  Thyroid Function Tests: No results for input(s): TSH, T4TOTAL, FREET4, T3FREE, THYROIDAB in the last 72 hours. Anemia Panel: No results for input(s): VITAMINB12, FOLATE, FERRITIN, TIBC, IRON, RETICCTPCT in the last 72 hours. Urine analysis:    Component Value Date/Time   COLORURINE YELLOW 09/14/2020 2241   APPEARANCEUR HAZY (A) 09/14/2020 2241   LABSPEC 1.011 09/14/2020 2241   PHURINE 5.0 09/14/2020 2241   GLUCOSEU NEGATIVE 09/14/2020 2241   HGBUR MODERATE (A) 09/14/2020 2241   BILIRUBINUR NEGATIVE 09/14/2020 [place] 09/14/2020 2241   PROTEINUR 30 (A) 09/14/2020 2241   NITRITE NEGATIVE 09/14/2020 2241   LEUKOCYTESUR SMALL (A) 09/14/2020 2241   Sepsis Labs: @LABRCNTIP (procalcitonin:4,lacticidven:4)  ) Recent Results (from the past 240 hour(s))  Respiratory Panel by RT PCR (Flu A&B, Covid) - Nasopharyngeal Swab     Status: None   Collection Time: 09/15/20  2:36 AM   Specimen: Nasopharyngeal Swab  Result Value Ref Range Status   SARS Coronavirus 2 by RT PCR NEGATIVE NEGATIVE Final    Comment: (NOTE) SARS-CoV-2 target nucleic acids are NOT DETECTED.  The SARS-CoV-2 RNA is generally detectable in upper respiratoy specimens during the acute phase of infection. The lowest concentration of SARS-CoV-2 viral copies this assay can detect is 131 copies/mL. A negative result does not preclude SARS-Cov-2 infection  and should not be used as the sole basis for treatment or other patient management decisions. A negative result may occur with  improper specimen collection/handling, submission of specimen other than nasopharyngeal swab, presence of viral mutation(s) within the areas targeted by this assay, and inadequate number of viral copies (<131 copies/mL). A negative result must be combined with clinical observations, patient history, and epidemiological information. The expected result is Negative.  Fact Sheet for Patients:  PinkCheek.be  Fact Sheet for Healthcare Providers:  GravelBags.it  This test is no t yet approved or cleared by the [place] FDA and  has been authorized for detection and/or diagnosis of SARS-CoV-2 by FDA under an Emergency Use Authorization (EUA). This EUA will remain  in effect (meaning this test can be used) for the duration of the COVID-19 declaration under Section 564(b)(1) of the Act, 21 U.S.C. section 360bbb-3(b)(1), unless the authorization is terminated or revoked sooner.     Influenza A by PCR NEGATIVE NEGATIVE Final   Influenza B by PCR NEGATIVE NEGATIVE Final    Comment: (NOTE) The Xpert Xpress SARS-CoV-2/FLU/RSV assay is intended as an aid in  the diagnosis of influenza from Nasopharyngeal swab specimens and  should not be used as a sole basis for treatment. Nasal washings and  aspirates are unacceptable for Xpert Xpress SARS-CoV-2/FLU/RSV  testing.  Fact Sheet for Patients: PinkCheek.be  Fact Sheet for Healthcare Providers: GravelBags.it  This test is not yet approved or cleared by the [place] FDA and  has been authorized for detection and/or diagnosis of SARS-CoV-2 by  FDA under an Emergency Use Authorization (EUA). This EUA will remain  in effect (meaning this test can be used) for the duration of the  Covid-19 declaration  under Section 564(b)(1) of the Act, 21  U.S.C. section 360bbb-3(b)(1), unless the authorization is  terminated or revoked. Performed at [facility], [place] [address] [place], [place] [address]       Radiology Studies: No results found.   Scheduled Meds:  aspirin EC  81 mg Oral Daily   feeding supplement  237 mL Oral BID BM   heparin  5,000 Units Subcutaneous Q8H   levothyroxine  25 mcg Oral Q0600   metoprolol tartrate  50 mg Oral BID   pantoprazole  40 mg Oral Daily   Continuous Infusions:    LOS: 5 days   Time Spent in minutes   30 minutes  Angelys Yetman D.O. on 09/20/2020 at 1:08 PM  Between 7am to 7pm - Please see pager noted on [email or web address]  After 7pm go to [email or web address]  And look for the night coverage person covering for me after hours  [facility] Office  [phone]

## 2020-09-21 DIAGNOSIS — E039 Hypothyroidism, unspecified: Secondary | ICD-10-CM

## 2020-09-21 DIAGNOSIS — N179 Acute kidney failure, unspecified: Secondary | ICD-10-CM | POA: Diagnosis not present

## 2020-09-21 DIAGNOSIS — I10 Essential (primary) hypertension: Secondary | ICD-10-CM | POA: Diagnosis not present

## 2020-09-21 DIAGNOSIS — E46 Unspecified protein-calorie malnutrition: Secondary | ICD-10-CM | POA: Diagnosis not present

## 2020-09-21 DIAGNOSIS — E876 Hypokalemia: Secondary | ICD-10-CM | POA: Diagnosis not present

## 2020-09-21 LAB — BASIC METABOLIC PANEL
Anion gap: 11 (ref 5–15)
BUN: 36 mg/dL — ABNORMAL HIGH (ref 8–23)
CO2: 25 mmol/L (ref 22–32)
Calcium: 9.4 mg/dL (ref 8.9–10.3)
Chloride: 106 mmol/L (ref 98–111)
Creatinine, Ser: 2.06 mg/dL — ABNORMAL HIGH (ref 0.44–1.00)
GFR, Estimated: 22 mL/min — ABNORMAL LOW (ref >60)
Glucose, Bld: 157 mg/dL — ABNORMAL HIGH (ref 70–99)
Potassium: 3.4 mmol/L — ABNORMAL LOW (ref 3.5–5.1)
Sodium: 142 mmol/L (ref 135–145)

## 2020-09-21 MED ORDER — LEVOTHYROXINE SODIUM 25 MCG PO TABS: 25.0000 ug | ORAL_TABLET | Freq: Every day | ORAL | 1 refills | Status: DC

## 2020-09-21 MED ORDER — POTASSIUM CHLORIDE CRYS ER 10 MEQ PO TBCR: 10.0000 meq | EXTENDED_RELEASE_TABLET | ORAL | 0 refills | Status: DC

## 2020-09-21 MED ORDER — ENSURE ENLIVE PO LIQD: 237.0000 mL | Freq: Two times a day (BID) | ORAL | Status: DC

## 2020-09-21 NOTE — Progress Notes (Signed)
Physical Therapy Treatment Patient Details Name: Rachel Ho MRN: [id number] DOB: 03/17/1935 Today's Date: 09/21/2020    History of Present Illness Pt is 85 y.o. female with a past medical history significant for asthma, COPD, HTN, and recent AAA (August 2021) admitted at the urging of her PCP for elevated BUN/C. Pt states she has had poor PO intake and increased weakness over the last few months.    PT Comments    Pt anticipates d/c home this afternoon. Focus of session was getting dressed and performing ADL's in room ti assess assist level needed by daughter. Pt grossly functioning at a min guard assist level with frequent min assist for walker management, balance, and cognition. Daughter quick to step in and redirect patient, however she was educated to let the pt make mistakes during functional mobility and problem solve on her own to fix it. For example, letting the patient hit the bed with the walker instead of cueing her to turn several times before she gets to the chair. Will continue to follow and progress as able per POC.    Follow Up Recommendations  Home health PT;Supervision/Assistance - 24 hour     Equipment Recommendations  Rolling walker with 5" wheels;3in1 (PT)    Recommendations for Other Services       Precautions / Restrictions Precautions Precautions: Fall Restrictions Weight Bearing Restrictions: No    Mobility  Bed Mobility Overal bed mobility: Needs Assistance Bed Mobility: Supine to Sit;Sit to Supine     Supine to sit: Supervision Sit to supine: Supervision   General bed mobility comments: Supervision grossly for safety however pt requires assist to get positioned correctly once back in supine at end of session.   Transfers Overall transfer level: Needs assistance Equipment used: Rolling walker (2 wheeled) Transfers: Sit to/from Stand Sit to Stand: Min guard;Min assist         General transfer comment: Close guard with occasional assist  for power-up to full standing position. Multimodal cues for hand placement on seated surface for safety.   Ambulation/Gait Ambulation/Gait assistance: Min assist Gait Distance (Feet): 25 Feet Assistive device: Rolling walker (2 wheeled) Gait Pattern/deviations: Step-through pattern;Decreased stride length;Shuffle;Trunk flexed Gait velocity: Decreased Gait velocity interpretation: <1.31 ft/sec, indicative of household ambulator General Gait Details: Frequent assist for walker management and preventing pt from advancing walker if feet were not in a safe position, as she was getting feet caught up in the walker legs frequently.    Stairs             Wheelchair Mobility    Modified Rankin (Stroke Patients Only)       Balance Overall balance assessment: Needs assistance Sitting-balance support: Feet supported Sitting balance-Leahy Scale: Fair Sitting balance - Comments: able to sit EOB without PT support Postural control: Posterior lean Standing balance support: Single extremity supported Standing balance-Leahy Scale: Poor Standing balance comment: reliant on external assist; had difficulty washing hands at the sink without 1 hand on the counter for support.                             Cognition Arousal/Alertness: Awake/alert Behavior During Therapy: WFL for tasks assessed/performed Overall Cognitive Status: Impaired/Different from baseline Area of Impairment: Following commands;Safety/judgement;Problem solving;Awareness                       Following Commands: Follows one step commands with increased time;Follows multi-step commands inconsistently Safety/Judgement: Decreased awareness of  safety;Decreased awareness of deficits Awareness: Intellectual Problem Solving: Slow processing;Requires verbal cues;Requires tactile cues General Comments: Increased processing time, decreased problem solving and short term memory. Would forget where she was supposed  to be walking on her way to the sink or to the bed.       Exercises      General Comments        Pertinent Vitals/Pain Pain Assessment: 0-10 Faces Pain Scale: Hurts a little bit Pain Location: skin tear on RUE Pain Descriptors / Indicators: Sore Pain Intervention(s): Limited activity within patient's tolerance;Monitored during session;Repositioned    Home Living                      Prior Function            PT Goals (current goals can now be found in the care plan section) Acute Rehab PT Goals Patient Stated Goal: Home today PT Goal Formulation: With patient Time For Goal Achievement: 09/30/20 Potential to Achieve Goals: Fair Progress towards PT goals: Progressing toward goals    Frequency    Min 3X/week      PT Plan Current plan remains appropriate    Co-evaluation              AM-PAC PT "6 Clicks" Mobility   Outcome Measure  Help needed turning from your back to your side while in a flat bed without using bedrails?: A Little Help needed moving from lying on your back to sitting on the side of a flat bed without using bedrails?: A Little Help needed moving to and from a bed to a chair (including a wheelchair)?: A Little Help needed standing up from a chair using your arms (e.g., wheelchair or bedside chair)?: A Little Help needed to walk in hospital room?: A Little Help needed climbing 3-5 steps with a railing? : A Lot 6 Click Score: 17    End of Session Equipment Utilized During Treatment: Gait belt Activity Tolerance: Patient tolerated treatment well Patient left: in bed;with call bell/phone within reach;with bed alarm set Nurse Communication: Mobility status PT Visit Diagnosis: Unsteadiness on feet (R26.81);Other abnormalities of gait and mobility (R26.89);Muscle weakness (generalized) (M62.81)     Time: 2376-2831 PT Time Calculation (min) (ACUTE ONLY): 57 min  Charges:  $Gait Training: 23-37 mins $Therapeutic Activity: 8-22  mins $Self Care/Home Management: 8-22                     Rolinda Roan, PT, DPT Acute Rehabilitation Services Pager: [phone] Office: [place] 09/21/2020, 1:22 PM

## 2020-09-21 NOTE — Discharge Summary (Signed)
Physician Discharge Summary  Rachel Ho BTD:[id number] DOB: 05/09/1935 DOA: 09/14/2020  PCP: Ronita Hipps, MD  Admit date: 09/14/2020 Discharge date: 09/21/2020  Time spent: 45 minutes  Recommendations for Outpatient Follow-up:  Patient will be discharged to home with home health services.  Patient will need to follow up with primary care provider within one week of discharge, repeat BMP. Repeat thyroid function tests in 4-6 weeks, and dosing of synthroid may need to be adjusted.  Patient should continue medications as prescribed.  Patient should follow a heart healthy diet.   Discharge Diagnoses:  Acute kidney injury Essential hypertension Malnutrition/Underweight Asthma GERD Physical deconditioning  Abnormal thyroid function- hypothyroidism Hypokalemia ?ST Depression noted on tele monitoring  Discharge Condition: Stable  Diet recommendation: heart healthy  Filed Weights   09/17/20 0506 09/19/20 0630 09/20/20 0130  Weight: 47.6 kg 48.8 kg 50 kg    History of present illness:  On 09/15/2020 by Dr. Edwin Cap a 85 y.o.femalewith medical history significant forasthma and hypertension presents per PCP for concerns of abnormal lab.  She endorses poor po intake since November 2020 due to her salt was taken away. She endorses baseline generalized weakness. She denies urinary/bowel changes.   She denies chest pain, shortness of breath, abdominal pain, diarrhea, constipation, nausea, vomiting, vision changes, headache, fever, chills, cough beyond baseline Am cough.   She states food still tastes good to her, only that since salt is taken way, food does not taste good anymore.   She lives at home by herself.  Hospital Course:  Acute kidney injury -Unclear etiology, possibly secondary to poor oral intake in conjunction with medications -Patient's creatinine level was approximately 0.9 back in June 2021 -Patient stated that since her salt intake  has been reduced, she has had very poor appetite -Patient currently on IV fluids-encouraging oral intake -Renal ultrasound showed bilateral echogenic parenchyma suggestive of chronic renal disease and otherwise grossly unremarkable renal ultrasound  -SPEP negative -Nephrology consulted and appreciated -Creatinine on admission was 4.04 however increased to 4.14.  Today down to 2.30 -Patient may now have some component of chronic kidney disease at baseline -Repeat BMP in one week   Essential hypertension -Home medications of olmesartan, Lasix held due AKI -BP stable -Continue metoprolol  Malnutrition/Underweight -Dietitian consulted -Recommended feeding supplementation -Continue to monitor daily weights as well as intake and output -10% of meal documented on 09/17/20; 20% documented on 09/18/2020  Asthma -Currently no wheezing, continue albuterol as needed  GERD -Continue PPI   Physical deconditioning  -PT recommended HH vs SNF pending patient's progress -OT recommending SNF -Placement pending further discussions with family, TOC aware  Abnormal thyroid function- hypothyroidism -TSH 232.638, FT4 0.3 -Placed on low-dose Synthroid.  Patient may need increased dose however given her age would start at a lower dose -Patient will need repeat thyroid function studies in 4 to 6 weeks  Hypokalemia -Resolved with replacement. Magnesium 1.8 -Continue supplementation -Repeat BMP in one week  ?ST Depression noted on tele monitoring -No complaints of chest pain -EKG obtained and unremarkable for any ST depression  Consultants Nephrology   Procedures  Renal US  Discharge Exam: Vitals:   09/20/20 2304 09/21/20 0530  BP: (!) 165/64 (!) 147/66  Pulse: (!) 59 72  Resp: 20 20  Temp: 98.2 F (36.8 C) 98.6 F (37 C)  SpO2: 100% 99%   Exam  General: Well developed, thin, frail, elderly, NAD  HEENT: NCAT, mucous membranes moist.   Cardiovascular: S1 S2  auscultated, RRR  Respiratory: Clear to auscultation bilaterally no wheezing  Abdomen: Soft, nontender, nondistended, + bowel sounds  Extremities: warm dry without cyanosis clubbing or edema  Neuro: AAOx3, nonfocal  Psych: appropriate mood and affect, pleasant  Discharge Instructions Discharge Instructions    Diet - low sodium heart healthy   Complete by: As directed    Discharge instructions   Complete by: As directed    Patient will be discharged to home with home health services.  Patient will need to follow up with primary care provider within one week of discharge, repeat BMP. Repeat thyroid function tests in 4-6 weeks, and dosing of synthroid may need to be adjusted.  Patient should continue medications as prescribed.  Patient should follow a heart healthy diet.   Increase oral intake of water. Use Ensure to supplement her diet.   Increase activity slowly   Complete by: As directed      Allergies as of 09/21/2020      Reactions   Sulfa Antibiotics    Patient advised this was several years ago but she's had sulfur since then.       Medication List    STOP taking these medications   furosemide 20 MG tablet Commonly known as: LASIX   Livalo 1 MG Tabs Generic drug: Pitavastatin Calcium   olmesartan 40 MG tablet Commonly known as: BENICAR     TAKE these medications   Aspirin Low Dose 81 MG EC tablet Generic drug: aspirin Take 81 mg by mouth daily.   esomeprazole 40 MG capsule Commonly known as: NEXIUM TAKE ONE CAPSULE BY MOUTH EVERY DAY   ezetimibe 10 MG tablet Commonly known as: ZETIA Take 10 mg by mouth daily.   feeding supplement Liqd Take 237 mLs by mouth 2 (two) times daily between meals.   fluconazole 100 MG tablet Commonly known as: DIFLUCAN Take 100 mg by mouth daily.   Fluocinolone Acetonide 0.01 % Oil Place 1 drop in ear(s) daily as needed.   fluticasone 50 MCG/ACT nasal spray Commonly known as: FLONASE Place 2 sprays into both nostrils  daily.   levothyroxine 25 MCG tablet Commonly known as: SYNTHROID Take 1 tablet (25 mcg total) by mouth daily at 6 (six) AM. Start taking on: September 22, 2020   Linzess 145 MCG Caps capsule Generic drug: linaclotide as needed.   loratadine 10 MG tablet Commonly known as: CLARITIN Take 10 mg by mouth daily as needed for allergies.   Lumigan 0.01 % Soln Generic drug: bimatoprost   metoprolol tartrate 50 MG tablet Commonly known as: LOPRESSOR Take 50 mg by mouth 2 (two) times daily.   mometasone 0.1 % cream Commonly known as: ELOCON Apply to affected areas once daily until resolved What changed:   how much to take  how to take this  when to take this   nitroGLYCERIN 0.4 MG SL tablet Commonly known as: NITROSTAT Place 0.4 mg under the tongue.   ONE-A-DAY WOMENS PO Take by mouth daily.   oxybutynin 5 MG tablet Commonly known as: DITROPAN   potassium chloride 10 MEQ tablet Commonly known as: KLOR-CON Take 1 tablet (10 mEq total) by mouth every other day. What changed:   medication strength  how much to take  when to take this   ProAir HFA 108 (90 Base) MCG/ACT inhaler Generic drug: albuterol Inhale two puffs every four to six hours as needed for cough or wheeze. What changed:   how much to take  how to take this  when to take this  rosuvastatin 10 MG tablet Commonly known as: CRESTOR Take 10 mg by mouth daily.   SALINE MIST 0.65 % nasal spray Generic drug: sodium chloride Place 1 spray into the nose as needed for congestion.   Symbicort 160-4.5 MCG/ACT inhaler Generic drug: budesonide-formoterol INHALE 2 PUFFS BY MOUTH 2 TIMES A DAY What changed: See the new instructions.   tolterodine 2 MG 24 hr capsule Commonly known as: DETROL LA Take 2 mg by mouth daily.   VITAMIN D PO Take by mouth daily.            Durable Medical Equipment  (From admission, onward)         Start     Ordered   09/21/20 1039  For home use only DME 3 n 1   Once        09/21/20 1039   09/21/20 1039  For home use only DME Walker rolling  Once       Question Answer Comment  Walker: With 5 Inch Wheels   Patient needs a walker to treat with the following condition Weakness      09/21/20 1039         Allergies  Allergen Reactions  . Sulfa Antibiotics     Patient advised this was several years ago but she's had sulfur since then.     Follow-up Information    Ronita Hipps, MD. Schedule an appointment as soon as possible for a visit in 1 week(s).   Specialty: Family Medicine Why: Hospital follow up Contact information: [place] [facility].   Specialty: Home Health Services Contact information: [address] [place] [address] [phone]                The results of significant diagnostics from this hospitalization (including imaging, microbiology, ancillary and laboratory) are listed below for reference.    Significant Diagnostic Studies: CT HEAD WO CONTRAST  Result Date: 09/15/2020 CLINICAL DATA:  85 year old female with delirium. EXAM: CT HEAD WITHOUT CONTRAST TECHNIQUE: Contiguous axial images were obtained from the base of the skull through the vertex without intravenous contrast. COMPARISON:  [facility] head CT 08/11/2019. FINDINGS: Brain: No midline shift, ventriculomegaly, mass effect, evidence of mass lesion, intracranial hemorrhage or evidence of cortically based acute infarction. Cavum vergae, normal variant. Mild for age scattered white matter hypodensity. Otherwise normal gray-white matter differentiation. No cortical encephalomalacia identified. Vascular: Mild Calcified atherosclerosis at the skull base. No suspicious intracranial vascular hyperdensity. Skull: No acute osseous abnormality identified. Sinuses/Orbits: Visualized paranasal sinuses and mastoids are stable and well pneumatized. Other: No acute orbit or scalp  soft tissue findings. IMPRESSION: 1. No acute intracranial abnormality. 2. Mild for age cerebral white matter changes, most commonly due to small vessel disease. Electronically Signed   By: Genevie Ann M.D.   On: 09/15/2020 15:07   US Renal  Result Date: 09/15/2020 CLINICAL DATA:  Acute renal injury EXAM: RENAL / URINARY TRACT ULTRASOUND COMPLETE COMPARISON:  CT abdomen pelvis 10/23/2019. FINDINGS: Right Kidney: Renal measurements: 9.4 x 3.4 x 5.1 = volume: 84 mL. Increased echogenicity with prominent medullary pyramids. There is a 1.5 cm cystic lesion within the right kidney that is grossly stable in size compared to prior CT 2020. No solid mass or hydronephrosis visualized. Left Kidney: Renal measurements: 8.5 x 4.4 x 4 = volume: 79 mL. Increased echogenicity with prominent medullary pyramids. There is a 3.8 cm  cystic lesion within the left kidney that is grossly similar slightly decreased in size compared to prior CT 2020. No solid mass or hydronephrosis visualized. Urinary bladder: Appears normal for degree of bladder distention. Other: None. IMPRESSION: 1. Bilateral echogenic parenchyma suggestive of chronic renal disease. 2. Otherwise grossly unremarkable renal ultrasound. Electronically Signed   By: Iven Finn M.D.   On: 09/15/2020 01:55    Microbiology: Recent Results (from the past 240 hour(s))  Respiratory Panel by RT PCR (Flu A&B, Covid) - Nasopharyngeal Swab     Status: None   Collection Time: 09/15/20  2:36 AM   Specimen: Nasopharyngeal Swab  Result Value Ref Range Status   SARS Coronavirus 2 by RT PCR NEGATIVE NEGATIVE Final    Comment: (NOTE) SARS-CoV-2 target nucleic acids are NOT DETECTED.  The SARS-CoV-2 RNA is generally detectable in upper respiratoy specimens during the acute phase of infection. The lowest concentration of SARS-CoV-2 viral copies this assay can detect is 131 copies/mL. A negative result does not preclude SARS-Cov-2 infection and should not be used as the  sole basis for treatment or other patient management decisions. A negative result may occur with  improper specimen collection/handling, submission of specimen other than nasopharyngeal swab, presence of viral mutation(s) within the areas targeted by this assay, and inadequate number of viral copies (<131 copies/mL). A negative result must be combined with clinical observations, patient history, and epidemiological information. The expected result is Negative.  Fact Sheet for Patients:  PinkCheek.be  Fact Sheet for Healthcare Providers:  GravelBags.it  This test is no t yet approved or cleared by the [place] FDA and  has been authorized for detection and/or diagnosis of SARS-CoV-2 by FDA under an Emergency Use Authorization (EUA). This EUA will remain  in effect (meaning this test can be used) for the duration of the COVID-19 declaration under Section 564(b)(1) of the Act, 21 U.S.C. section 360bbb-3(b)(1), unless the authorization is terminated or revoked sooner.     Influenza A by PCR NEGATIVE NEGATIVE Final   Influenza B by PCR NEGATIVE NEGATIVE Final    Comment: (NOTE) The Xpert Xpress SARS-CoV-2/FLU/RSV assay is intended as an aid in  the diagnosis of influenza from Nasopharyngeal swab specimens and  should not be used as a sole basis for treatment. Nasal washings and  aspirates are unacceptable for Xpert Xpress SARS-CoV-2/FLU/RSV  testing.  Fact Sheet for Patients: PinkCheek.be  Fact Sheet for Healthcare Providers: GravelBags.it  This test is not yet approved or cleared by the [place] FDA and  has been authorized for detection and/or diagnosis of SARS-CoV-2 by  FDA under an Emergency Use Authorization (EUA). This EUA will remain  in effect (meaning this test can be used) for the duration of the  Covid-19 declaration under Section 564(b)(1) of  the Act, 21  U.S.C. section 360bbb-3(b)(1), unless the authorization is  terminated or revoked. Performed at [facility], [address] [place], [place] [address]      Labs: Basic Metabolic Panel: Recent Labs  Lab 09/16/20 [phone] 09/16/20 1829 09/16/20 1420 09/17/20 0035 09/18/20 0259 09/19/20 0354 09/19/20 1510 09/20/20 0055 09/21/20 0736  NA 141   < >  --  143 139 141  --  141 142  K 3.7   < >  --  4.1 3.7 3.4*  --  3.9 3.4*  CL 115*   < >  --  116* 109 110  --  108 106  CO2 17*   < >  --  18* 19* 20*  --  23 25  GLUCOSE 107*   < >  --  77 85 93  --  133* 157*  BUN 87*   < >  --  79* 57* 43*  --  44* 36*  CREATININE 3.36*   < >  --  3.12* 2.62* 2.32*  --  2.30* 2.06*  CALCIUM 10.0   < > 9.3 9.7 9.6 9.3  --  9.4 9.4  MG 2.3  --   --   --   --   --  1.8  --   --   PHOS  --   --  2.4*  --   --  1.3*  --   --   --    < > = values in this interval not displayed.   Liver Function Tests: Recent Labs  Lab 09/17/20 0035 09/19/20 0354  AST 87*  --   ALT 42  --   ALKPHOS 52  --   BILITOT 0.2*  --   PROT 5.9*  --   ALBUMIN 2.5* 2.3*   No results for input(s): LIPASE, AMYLASE in the last 168 hours. Recent Labs  Lab 09/16/20 0822  AMMONIA 32   CBC: Recent Labs  Lab 09/14/20 1823 09/15/20 0118 09/16/20 0822 09/18/20 0259  WBC 7.7 6.9 5.8  --   NEUTROABS  --   --  3.9  --   HGB 12.1 12.7 10.6* 10.2*  HCT 37.9 39.8 33.2* 32.3*  MCV 82.0 81.9 83.6  --   PLT 178 183 144*  --    Cardiac Enzymes: No results for input(s): CKTOTAL, CKMB, CKMBINDEX, TROPONINI in the last 168 hours. BNP: BNP (last 3 results) No results for input(s): BNP in the last 8760 hours.  ProBNP (last 3 results) No results for input(s): PROBNP in the last 8760 hours.  CBG: No results for input(s): GLUCAP in the last 168 hours.     Signed:  Cristal Ford  [facility] 09/21/2020, 11:14 AM

## 2020-09-21 NOTE — Care Management Important Message (Signed)
Important Message  Patient Details  Name: Rachel Ho MRN: [id number] Date of Birth: May 07, 1935   Medicare Important Message Given:  Yes - Important Message mailed due to current National Emergency  Verbal consent obtained due to current National Emergency  Relationship to patient: Self Contact Name: Shenicka Sunderlin Call Date: 09/21/20  Time: 0950 Phone: [phone] Outcome: No Answer/Busy Important Message mailed to: Patient address on file    Delorse Lek 09/21/2020, 9:51 AM

## 2020-09-21 NOTE — Discharge Instructions (Signed)

## 2020-09-21 NOTE — Progress Notes (Signed)
Pt IV removed, catheter intact. Telemetry removed ccmd aware. Pt d/c education provided at bedside to pt and family. Pt has all belongings. Pt d/c via wheelchair with NT.

## 2020-09-21 NOTE — TOC Initial Note (Addendum)
Transition of Care [facility]) - Initial/Assessment Note    Patient Details  Name: Rachel Ho MRN: [id number] Date of Birth: 1935-03-30  [facility]) CM/SW Contact:    Marilu Favre, RN Phone Number: 09/21/2020, 11:11 AM  Clinical Narrative:                 Spoke to patient and daughter Levada Dy at bedside. Family plans to take patient home and provide assistance.   Confirmed face sheet information with patient and Levada Dy.   They prefer [place] [facility] [place] spoke with Vicente Males. Derek requested referral be faxed to them at [phone]. Fax sent Vicente Males called back and accepted referral. DC summary faxed.  Ordered walker and 3 in1 through [place] .   Patient does not have home oxygen. Patient currently on oxygen. Secure chatted bedside nurse and MD   Expected Discharge Plan: [place] Barriers to Discharge: Continued Medical Work up   Patient Goals and CMS Choice Patient states their goals for this hospitalization and ongoing recovery are:: to return to home CMS Medicare.gov Compare Post Acute Care list provided to:: Patient Choice offered to / list presented to : Patient  Expected Discharge Plan and Services Expected Discharge Plan: [place]   Discharge Planning Services: CM Consult Post Acute Care Choice: [place] arrangements for the past 2 months: Single Family Home Expected Discharge Date: 09/21/20               DME Arranged: Berta Minor rolling DME Agency: [employer] Date DME Agency Contacted: 09/21/20 Time DME Agency Contacted: 1109 Representative spoke with at DME Agency: [place]: PT, OT [place] Agency: [place] [facility] Date [place]: 09/21/20 Time [address]: 55 Representative spoke with at [place]: [place] will review referral , referral faxed to [phone]  Prior Living Arrangements/Services Living arrangements  for the past 2 months: [place] with:: Self Patient language and need for interpreter reviewed:: Yes Do you feel safe going back to the place where you live?: Yes      Need for Family Participation in Patient Care: Yes (Comment) Care giver support system in place?: Yes (comment)   Criminal Activity/Legal Involvement Pertinent to Current Situation/Hospitalization: No - Comment as needed  Activities of Daily Living Home Assistive Devices/Equipment: None ADL Screening (condition at time of admission) Patient's cognitive ability adequate to safely complete daily activities?: Yes Is the patient deaf or have difficulty hearing?: No Does the patient have difficulty seeing, even when wearing glasses/contacts?: No Does the patient have difficulty concentrating, remembering, or making decisions?: Yes Patient able to express need for assistance with ADLs?: No Does the patient have difficulty dressing or bathing?: Yes Independently performs ADLs?: No Does the patient have difficulty walking or climbing stairs?: Yes Weakness of Legs: Both Weakness of Arms/Hands: Both  Permission Sought/Granted   Permission granted to share information with : Yes, Verbal Permission Granted  Share Information with NAME: daughter Levada Dy  Permission granted to share info w AGENCY: [facility]        Emotional Assessment Appearance:: Appears stated age Attitude/Demeanor/Rapport: Engaged Affect (typically observed): Accepting Orientation: : Oriented to Self, Oriented to Place, Oriented to  Time, Oriented to Situation Alcohol / Substance Use: Not Applicable Psych Involvement: No (comment)  Admission diagnosis:  Uremia [N19] Generalized weakness [R53.1] AKI (acute kidney injury) ([place]) [N17.9] Patient Active Problem List  Diagnosis Date Noted  . AKI (acute kidney injury) ([place]) 09/15/2020  . Essential hypertension 09/15/2020  . Protein malnutrition ([place]) 09/15/2020  .  Generalized weakness   . Obstructive lung disease (generalized) ([place]) 09/16/2015  . Allergic rhinitis 09/16/2015  . Nonallergic rhinitis 09/16/2015  . Laryngopharyngeal reflux (LPR) 09/16/2015   PCP:  Ronita Hipps, MD Pharmacy:   [place], [place] - [address] [place] [address] Phone: [phone] Fax: [phone]  [id number] [place], [place] [address] [place] [address] Phone: [phone] Fax: [phone]     Social Determinants of Health (SDOH) Interventions    Readmission Risk Interventions No flowsheet data found.

## 2020-09-23 LAB — PTH-RELATED PEPTIDE: PTH-related peptide: <2 pmol/L

## 2020-10-05 DIAGNOSIS — I10 Essential (primary) hypertension: Secondary | ICD-10-CM | POA: Diagnosis not present

## 2020-10-05 DIAGNOSIS — N179 Acute kidney failure, unspecified: Secondary | ICD-10-CM | POA: Diagnosis not present

## 2020-10-05 DIAGNOSIS — Z6820 Body mass index (BMI) 20.0-20.9, adult: Secondary | ICD-10-CM | POA: Diagnosis not present

## 2020-10-16 DIAGNOSIS — D649 Anemia, unspecified: Secondary | ICD-10-CM | POA: Diagnosis not present

## 2020-10-16 DIAGNOSIS — D509 Iron deficiency anemia, unspecified: Secondary | ICD-10-CM | POA: Diagnosis not present

## 2020-10-16 DIAGNOSIS — K5289 Other specified noninfective gastroenteritis and colitis: Secondary | ICD-10-CM | POA: Diagnosis not present

## 2020-10-16 DIAGNOSIS — I319 Disease of pericardium, unspecified: Secondary | ICD-10-CM

## 2020-10-16 DIAGNOSIS — I249 Acute ischemic heart disease, unspecified: Secondary | ICD-10-CM | POA: Diagnosis not present

## 2020-10-16 DIAGNOSIS — N189 Chronic kidney disease, unspecified: Secondary | ICD-10-CM

## 2020-10-16 DIAGNOSIS — I160 Hypertensive urgency: Secondary | ICD-10-CM

## 2020-10-16 DIAGNOSIS — K922 Gastrointestinal hemorrhage, unspecified: Secondary | ICD-10-CM

## 2020-10-16 DIAGNOSIS — Z681 Body mass index (BMI) 19 or less, adult: Secondary | ICD-10-CM | POA: Diagnosis not present

## 2020-10-16 DIAGNOSIS — R0789 Other chest pain: Secondary | ICD-10-CM

## 2020-10-16 DIAGNOSIS — K5790 Diverticulosis of intestine, part unspecified, without perforation or abscess without bleeding: Secondary | ICD-10-CM | POA: Diagnosis not present

## 2020-10-16 DIAGNOSIS — K921 Melena: Secondary | ICD-10-CM | POA: Diagnosis not present

## 2020-10-16 DIAGNOSIS — E43 Unspecified severe protein-calorie malnutrition: Secondary | ICD-10-CM | POA: Diagnosis not present

## 2020-10-16 DIAGNOSIS — K5900 Constipation, unspecified: Secondary | ICD-10-CM | POA: Diagnosis not present

## 2020-10-16 DIAGNOSIS — R079 Chest pain, unspecified: Secondary | ICD-10-CM | POA: Diagnosis not present

## 2020-10-16 DIAGNOSIS — I313 Pericardial effusion (noninflammatory): Secondary | ICD-10-CM | POA: Diagnosis not present

## 2020-10-16 DIAGNOSIS — J449 Chronic obstructive pulmonary disease, unspecified: Secondary | ICD-10-CM | POA: Diagnosis not present

## 2020-10-16 DIAGNOSIS — R58 Hemorrhage, not elsewhere classified: Secondary | ICD-10-CM | POA: Diagnosis not present

## 2020-10-16 DIAGNOSIS — I351 Nonrheumatic aortic (valve) insufficiency: Secondary | ICD-10-CM

## 2020-10-16 DIAGNOSIS — K219 Gastro-esophageal reflux disease without esophagitis: Secondary | ICD-10-CM | POA: Diagnosis not present

## 2020-10-16 DIAGNOSIS — K625 Hemorrhage of anus and rectum: Secondary | ICD-10-CM | POA: Diagnosis not present

## 2020-10-17 DIAGNOSIS — K5289 Other specified noninfective gastroenteritis and colitis: Secondary | ICD-10-CM | POA: Diagnosis not present

## 2020-10-17 DIAGNOSIS — R0789 Other chest pain: Secondary | ICD-10-CM | POA: Diagnosis not present

## 2020-10-17 DIAGNOSIS — I160 Hypertensive urgency: Secondary | ICD-10-CM | POA: Diagnosis not present

## 2020-10-17 DIAGNOSIS — K5790 Diverticulosis of intestine, part unspecified, without perforation or abscess without bleeding: Secondary | ICD-10-CM | POA: Diagnosis not present

## 2020-10-17 DIAGNOSIS — Z681 Body mass index (BMI) 19 or less, adult: Secondary | ICD-10-CM | POA: Diagnosis not present

## 2020-10-17 DIAGNOSIS — D649 Anemia, unspecified: Secondary | ICD-10-CM | POA: Diagnosis not present

## 2020-10-17 DIAGNOSIS — J449 Chronic obstructive pulmonary disease, unspecified: Secondary | ICD-10-CM | POA: Diagnosis not present

## 2020-10-17 DIAGNOSIS — R079 Chest pain, unspecified: Secondary | ICD-10-CM | POA: Diagnosis not present

## 2020-10-17 DIAGNOSIS — K625 Hemorrhage of anus and rectum: Secondary | ICD-10-CM | POA: Diagnosis not present

## 2020-10-17 DIAGNOSIS — I249 Acute ischemic heart disease, unspecified: Secondary | ICD-10-CM | POA: Diagnosis not present

## 2020-10-17 DIAGNOSIS — K5900 Constipation, unspecified: Secondary | ICD-10-CM | POA: Diagnosis not present

## 2020-10-17 DIAGNOSIS — E43 Unspecified severe protein-calorie malnutrition: Secondary | ICD-10-CM | POA: Diagnosis not present

## 2020-10-17 DIAGNOSIS — I313 Pericardial effusion (noninflammatory): Secondary | ICD-10-CM | POA: Diagnosis not present

## 2020-10-17 DIAGNOSIS — D509 Iron deficiency anemia, unspecified: Secondary | ICD-10-CM | POA: Diagnosis not present

## 2020-10-17 DIAGNOSIS — K921 Melena: Secondary | ICD-10-CM | POA: Diagnosis not present

## 2020-10-17 DIAGNOSIS — K219 Gastro-esophageal reflux disease without esophagitis: Secondary | ICD-10-CM | POA: Diagnosis not present

## 2020-10-17 DIAGNOSIS — K922 Gastrointestinal hemorrhage, unspecified: Secondary | ICD-10-CM | POA: Diagnosis not present

## 2020-10-18 DIAGNOSIS — D649 Anemia, unspecified: Secondary | ICD-10-CM | POA: Diagnosis not present

## 2020-10-18 DIAGNOSIS — K921 Melena: Secondary | ICD-10-CM | POA: Diagnosis not present

## 2020-10-18 DIAGNOSIS — E43 Unspecified severe protein-calorie malnutrition: Secondary | ICD-10-CM | POA: Diagnosis not present

## 2020-10-18 DIAGNOSIS — K5790 Diverticulosis of intestine, part unspecified, without perforation or abscess without bleeding: Secondary | ICD-10-CM | POA: Diagnosis not present

## 2020-10-18 DIAGNOSIS — K5289 Other specified noninfective gastroenteritis and colitis: Secondary | ICD-10-CM | POA: Diagnosis not present

## 2020-10-18 DIAGNOSIS — R0789 Other chest pain: Secondary | ICD-10-CM | POA: Diagnosis not present

## 2020-10-18 DIAGNOSIS — I313 Pericardial effusion (noninflammatory): Secondary | ICD-10-CM | POA: Diagnosis not present

## 2020-10-27 DIAGNOSIS — D500 Iron deficiency anemia secondary to blood loss (chronic): Secondary | ICD-10-CM | POA: Diagnosis not present

## 2020-10-27 DIAGNOSIS — Z681 Body mass index (BMI) 19 or less, adult: Secondary | ICD-10-CM | POA: Diagnosis not present

## 2020-10-27 DIAGNOSIS — E039 Hypothyroidism, unspecified: Secondary | ICD-10-CM | POA: Diagnosis not present

## 2020-11-04 DIAGNOSIS — D500 Iron deficiency anemia secondary to blood loss (chronic): Secondary | ICD-10-CM | POA: Diagnosis not present

## 2020-11-11 DIAGNOSIS — D500 Iron deficiency anemia secondary to blood loss (chronic): Secondary | ICD-10-CM | POA: Diagnosis not present

## 2020-12-31 DIAGNOSIS — R609 Edema, unspecified: Secondary | ICD-10-CM | POA: Diagnosis not present

## 2020-12-31 DIAGNOSIS — Z681 Body mass index (BMI) 19 or less, adult: Secondary | ICD-10-CM | POA: Diagnosis not present

## 2020-12-31 DIAGNOSIS — B379 Candidiasis, unspecified: Secondary | ICD-10-CM | POA: Diagnosis not present

## 2021-04-23 DIAGNOSIS — I10 Essential (primary) hypertension: Secondary | ICD-10-CM | POA: Diagnosis not present

## 2021-04-23 DIAGNOSIS — J4520 Mild intermittent asthma, uncomplicated: Secondary | ICD-10-CM | POA: Diagnosis not present

## 2021-05-20 DIAGNOSIS — E039 Hypothyroidism, unspecified: Secondary | ICD-10-CM | POA: Diagnosis not present

## 2021-05-20 DIAGNOSIS — Z1331 Encounter for screening for depression: Secondary | ICD-10-CM | POA: Diagnosis not present

## 2021-05-20 DIAGNOSIS — R159 Full incontinence of feces: Secondary | ICD-10-CM | POA: Diagnosis not present

## 2021-05-20 DIAGNOSIS — Z79899 Other long term (current) drug therapy: Secondary | ICD-10-CM | POA: Diagnosis not present

## 2021-05-20 DIAGNOSIS — Z0000 Encounter for general adult medical examination without abnormal findings: Secondary | ICD-10-CM | POA: Diagnosis not present

## 2021-05-20 DIAGNOSIS — M21949 Unspecified acquired deformity of hand, unspecified hand: Secondary | ICD-10-CM | POA: Diagnosis not present

## 2021-05-20 DIAGNOSIS — Z6820 Body mass index (BMI) 20.0-20.9, adult: Secondary | ICD-10-CM | POA: Diagnosis not present

## 2021-06-11 DIAGNOSIS — Z681 Body mass index (BMI) 19 or less, adult: Secondary | ICD-10-CM | POA: Diagnosis not present

## 2021-06-11 DIAGNOSIS — M25642 Stiffness of left hand, not elsewhere classified: Secondary | ICD-10-CM | POA: Diagnosis not present

## 2021-06-11 DIAGNOSIS — M24541 Contracture, right hand: Secondary | ICD-10-CM | POA: Diagnosis not present

## 2021-06-11 DIAGNOSIS — M25641 Stiffness of right hand, not elsewhere classified: Secondary | ICD-10-CM | POA: Diagnosis not present

## 2021-06-11 DIAGNOSIS — M25421 Effusion, right elbow: Secondary | ICD-10-CM | POA: Diagnosis not present

## 2021-06-11 DIAGNOSIS — R202 Paresthesia of skin: Secondary | ICD-10-CM | POA: Diagnosis not present

## 2021-06-11 DIAGNOSIS — M6281 Muscle weakness (generalized): Secondary | ICD-10-CM | POA: Diagnosis not present

## 2021-06-11 DIAGNOSIS — M24542 Contracture, left hand: Secondary | ICD-10-CM | POA: Diagnosis not present

## 2021-06-11 DIAGNOSIS — R200 Anesthesia of skin: Secondary | ICD-10-CM | POA: Diagnosis not present

## 2021-06-15 DIAGNOSIS — R202 Paresthesia of skin: Secondary | ICD-10-CM | POA: Diagnosis not present

## 2021-06-15 DIAGNOSIS — R200 Anesthesia of skin: Secondary | ICD-10-CM | POA: Diagnosis not present

## 2021-06-15 DIAGNOSIS — M24542 Contracture, left hand: Secondary | ICD-10-CM | POA: Diagnosis not present

## 2021-06-15 DIAGNOSIS — M24541 Contracture, right hand: Secondary | ICD-10-CM | POA: Diagnosis not present

## 2021-06-15 DIAGNOSIS — M25642 Stiffness of left hand, not elsewhere classified: Secondary | ICD-10-CM | POA: Diagnosis not present

## 2021-06-15 DIAGNOSIS — M25641 Stiffness of right hand, not elsewhere classified: Secondary | ICD-10-CM | POA: Diagnosis not present

## 2021-06-15 DIAGNOSIS — M6281 Muscle weakness (generalized): Secondary | ICD-10-CM | POA: Diagnosis not present

## 2021-06-17 DIAGNOSIS — I714 Abdominal aortic aneurysm, without rupture: Secondary | ICD-10-CM | POA: Diagnosis not present

## 2021-06-22 DIAGNOSIS — M24541 Contracture, right hand: Secondary | ICD-10-CM | POA: Diagnosis not present

## 2021-06-22 DIAGNOSIS — R202 Paresthesia of skin: Secondary | ICD-10-CM | POA: Diagnosis not present

## 2021-06-22 DIAGNOSIS — M24542 Contracture, left hand: Secondary | ICD-10-CM | POA: Diagnosis not present

## 2021-06-22 DIAGNOSIS — M25641 Stiffness of right hand, not elsewhere classified: Secondary | ICD-10-CM | POA: Diagnosis not present

## 2021-06-22 DIAGNOSIS — M6281 Muscle weakness (generalized): Secondary | ICD-10-CM | POA: Diagnosis not present

## 2021-06-22 DIAGNOSIS — R200 Anesthesia of skin: Secondary | ICD-10-CM | POA: Diagnosis not present

## 2021-06-22 DIAGNOSIS — M25642 Stiffness of left hand, not elsewhere classified: Secondary | ICD-10-CM | POA: Diagnosis not present

## 2021-06-29 DIAGNOSIS — M25641 Stiffness of right hand, not elsewhere classified: Secondary | ICD-10-CM | POA: Diagnosis not present

## 2021-06-29 DIAGNOSIS — M24542 Contracture, left hand: Secondary | ICD-10-CM | POA: Diagnosis not present

## 2021-06-29 DIAGNOSIS — R202 Paresthesia of skin: Secondary | ICD-10-CM | POA: Diagnosis not present

## 2021-06-29 DIAGNOSIS — R200 Anesthesia of skin: Secondary | ICD-10-CM | POA: Diagnosis not present

## 2021-06-29 DIAGNOSIS — M25642 Stiffness of left hand, not elsewhere classified: Secondary | ICD-10-CM | POA: Diagnosis not present

## 2021-06-29 DIAGNOSIS — M6281 Muscle weakness (generalized): Secondary | ICD-10-CM | POA: Diagnosis not present

## 2021-06-29 DIAGNOSIS — M24541 Contracture, right hand: Secondary | ICD-10-CM | POA: Diagnosis not present

## 2021-07-13 DIAGNOSIS — M25642 Stiffness of left hand, not elsewhere classified: Secondary | ICD-10-CM | POA: Diagnosis not present

## 2021-07-13 DIAGNOSIS — M6281 Muscle weakness (generalized): Secondary | ICD-10-CM | POA: Diagnosis not present

## 2021-07-13 DIAGNOSIS — R200 Anesthesia of skin: Secondary | ICD-10-CM | POA: Diagnosis not present

## 2021-07-13 DIAGNOSIS — M24542 Contracture, left hand: Secondary | ICD-10-CM | POA: Diagnosis not present

## 2021-07-13 DIAGNOSIS — M25641 Stiffness of right hand, not elsewhere classified: Secondary | ICD-10-CM | POA: Diagnosis not present

## 2021-07-13 DIAGNOSIS — R202 Paresthesia of skin: Secondary | ICD-10-CM | POA: Diagnosis not present

## 2021-07-13 DIAGNOSIS — M24541 Contracture, right hand: Secondary | ICD-10-CM | POA: Diagnosis not present

## 2021-07-21 DIAGNOSIS — Z1231 Encounter for screening mammogram for malignant neoplasm of breast: Secondary | ICD-10-CM | POA: Diagnosis not present

## 2021-07-27 DIAGNOSIS — M6281 Muscle weakness (generalized): Secondary | ICD-10-CM | POA: Diagnosis not present

## 2021-07-27 DIAGNOSIS — M25641 Stiffness of right hand, not elsewhere classified: Secondary | ICD-10-CM | POA: Diagnosis not present

## 2021-07-27 DIAGNOSIS — M25642 Stiffness of left hand, not elsewhere classified: Secondary | ICD-10-CM | POA: Diagnosis not present

## 2021-07-27 DIAGNOSIS — M24542 Contracture, left hand: Secondary | ICD-10-CM | POA: Diagnosis not present

## 2021-07-27 DIAGNOSIS — R200 Anesthesia of skin: Secondary | ICD-10-CM | POA: Diagnosis not present

## 2021-07-27 DIAGNOSIS — M24541 Contracture, right hand: Secondary | ICD-10-CM | POA: Diagnosis not present

## 2021-07-27 DIAGNOSIS — R202 Paresthesia of skin: Secondary | ICD-10-CM | POA: Diagnosis not present

## 2021-08-19 DIAGNOSIS — I723 Aneurysm of iliac artery: Secondary | ICD-10-CM | POA: Diagnosis not present

## 2021-08-19 DIAGNOSIS — Z48812 Encounter for surgical aftercare following surgery on the circulatory system: Secondary | ICD-10-CM | POA: Diagnosis not present

## 2021-08-19 DIAGNOSIS — M7989 Other specified soft tissue disorders: Secondary | ICD-10-CM | POA: Diagnosis not present

## 2021-08-19 DIAGNOSIS — I714 Abdominal aortic aneurysm, without rupture: Secondary | ICD-10-CM | POA: Diagnosis not present

## 2021-08-26 DIAGNOSIS — Z6820 Body mass index (BMI) 20.0-20.9, adult: Secondary | ICD-10-CM | POA: Diagnosis not present

## 2021-08-26 DIAGNOSIS — K219 Gastro-esophageal reflux disease without esophagitis: Secondary | ICD-10-CM | POA: Diagnosis not present

## 2021-08-26 DIAGNOSIS — E039 Hypothyroidism, unspecified: Secondary | ICD-10-CM | POA: Diagnosis not present

## 2021-08-26 DIAGNOSIS — Z23 Encounter for immunization: Secondary | ICD-10-CM | POA: Diagnosis not present

## 2021-08-31 DIAGNOSIS — M6281 Muscle weakness (generalized): Secondary | ICD-10-CM | POA: Diagnosis not present

## 2021-08-31 DIAGNOSIS — R200 Anesthesia of skin: Secondary | ICD-10-CM | POA: Diagnosis not present

## 2021-08-31 DIAGNOSIS — M24542 Contracture, left hand: Secondary | ICD-10-CM | POA: Diagnosis not present

## 2021-08-31 DIAGNOSIS — R202 Paresthesia of skin: Secondary | ICD-10-CM | POA: Diagnosis not present

## 2021-08-31 DIAGNOSIS — M24541 Contracture, right hand: Secondary | ICD-10-CM | POA: Diagnosis not present

## 2021-08-31 DIAGNOSIS — M25642 Stiffness of left hand, not elsewhere classified: Secondary | ICD-10-CM | POA: Diagnosis not present

## 2021-08-31 DIAGNOSIS — M25641 Stiffness of right hand, not elsewhere classified: Secondary | ICD-10-CM | POA: Diagnosis not present

## 2021-09-28 DIAGNOSIS — Z961 Presence of intraocular lens: Secondary | ICD-10-CM | POA: Diagnosis not present

## 2021-09-28 DIAGNOSIS — H401232 Low-tension glaucoma, bilateral, moderate stage: Secondary | ICD-10-CM | POA: Diagnosis not present

## 2021-09-28 DIAGNOSIS — H5203 Hypermetropia, bilateral: Secondary | ICD-10-CM | POA: Diagnosis not present

## 2021-11-08 DIAGNOSIS — Z961 Presence of intraocular lens: Secondary | ICD-10-CM | POA: Diagnosis not present

## 2021-11-08 DIAGNOSIS — H401232 Low-tension glaucoma, bilateral, moderate stage: Secondary | ICD-10-CM | POA: Diagnosis not present

## 2021-12-03 DIAGNOSIS — I10 Essential (primary) hypertension: Secondary | ICD-10-CM | POA: Diagnosis not present

## 2021-12-03 DIAGNOSIS — I4819 Other persistent atrial fibrillation: Secondary | ICD-10-CM | POA: Diagnosis not present

## 2021-12-03 DIAGNOSIS — E782 Mixed hyperlipidemia: Secondary | ICD-10-CM | POA: Diagnosis not present

## 2022-01-03 DIAGNOSIS — H401232 Low-tension glaucoma, bilateral, moderate stage: Secondary | ICD-10-CM | POA: Diagnosis not present

## 2022-01-03 DIAGNOSIS — Z961 Presence of intraocular lens: Secondary | ICD-10-CM | POA: Diagnosis not present

## 2022-02-01 DIAGNOSIS — I10 Essential (primary) hypertension: Secondary | ICD-10-CM | POA: Diagnosis not present

## 2022-02-01 DIAGNOSIS — E782 Mixed hyperlipidemia: Secondary | ICD-10-CM | POA: Diagnosis not present

## 2022-02-01 DIAGNOSIS — I4819 Other persistent atrial fibrillation: Secondary | ICD-10-CM | POA: Diagnosis not present

## 2022-02-11 IMAGING — CT CT HEAD W/O CM
4 series · 16 of 47 positions shown, 18 images · non-contrast
Comparison: Helaine Ephraim CT 08/11/2019.

CLINICAL DATA: 85-year-old female with delirium.

EXAM:
CT HEAD WITHOUT CONTRAST
TECHNIQUE: Contiguous axial images were obtained from the base of the skull
through the vertex without intravenous contrast.

[Series 3: head wo · axial · 0.41mm/px · z∈[+1397,+1507]mm · 7 of 30 slices shown, 9 images]
[im 4/30  brain]
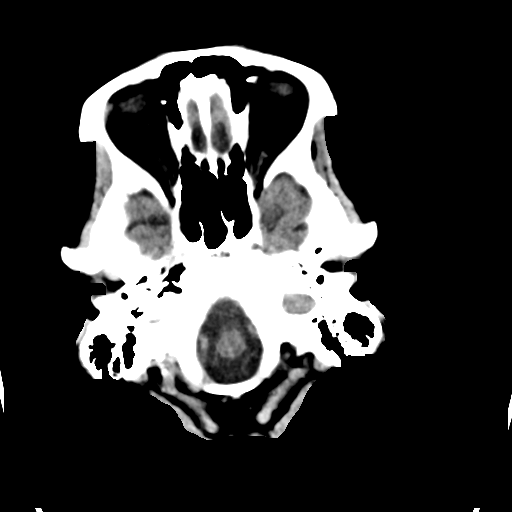
[im 4/30  bone]
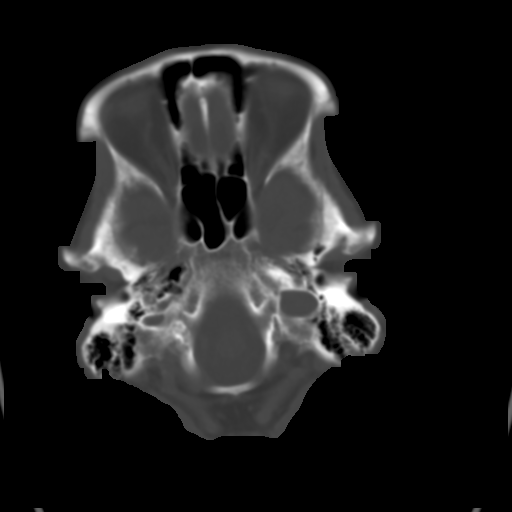
[im 8/30  brain]
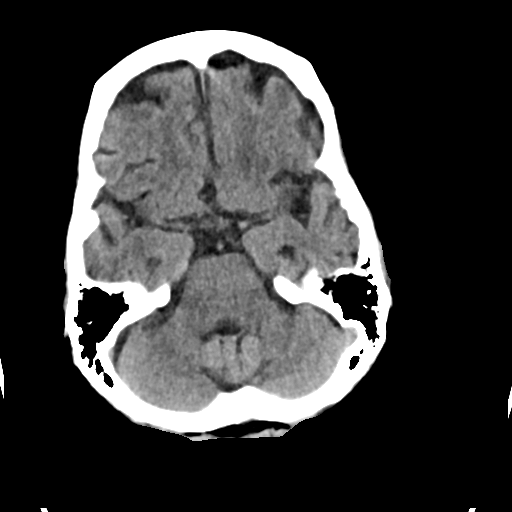
[im 11/30  brain]
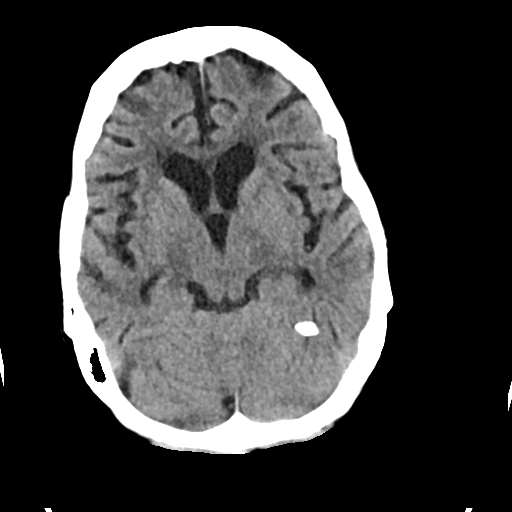
[im 15/30  brain]
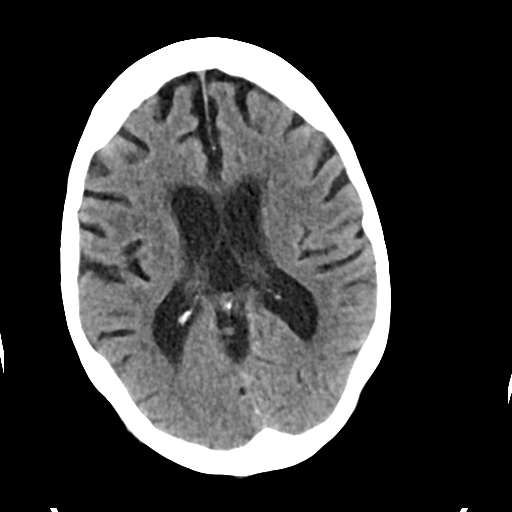
[im 19/30  brain]
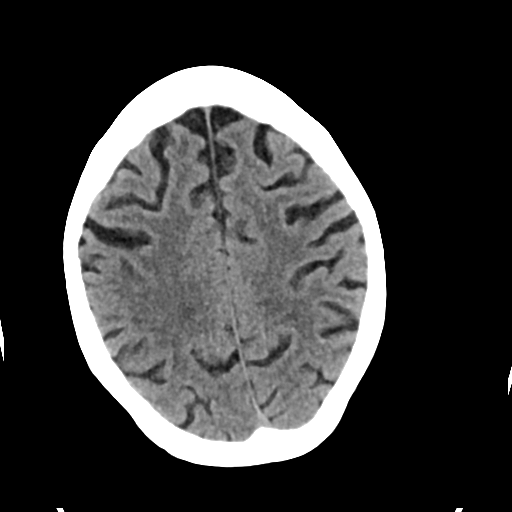
[im 19/30  bone]
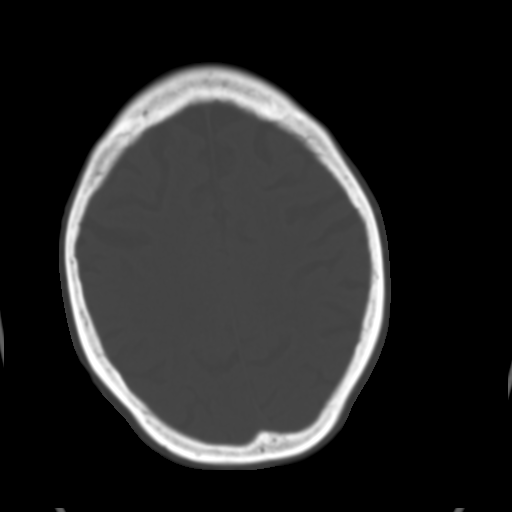
[im 22/30  brain]
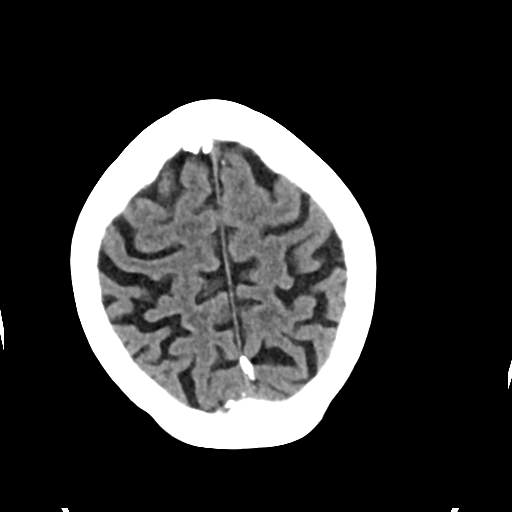
[im 26/30  brain]
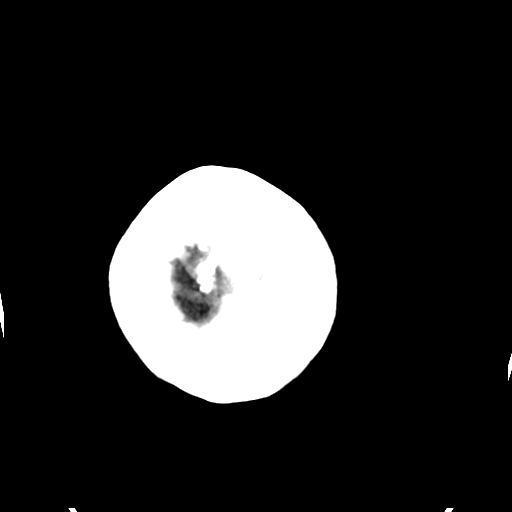

[Series 4: head bone · axial · 0.41mm/px · z∈[+1396,+1424]mm · 3 of 74 slices shown]
[im 8/74  bone]
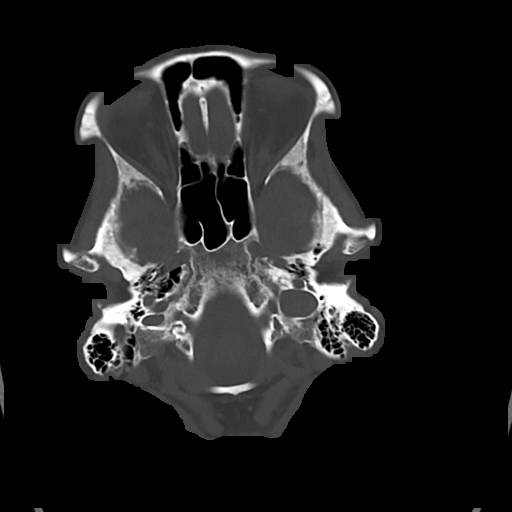
[im 15/74  bone]
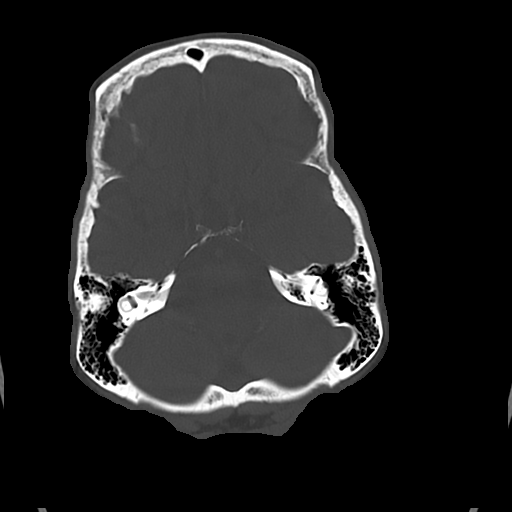
[im 22/74  bone]
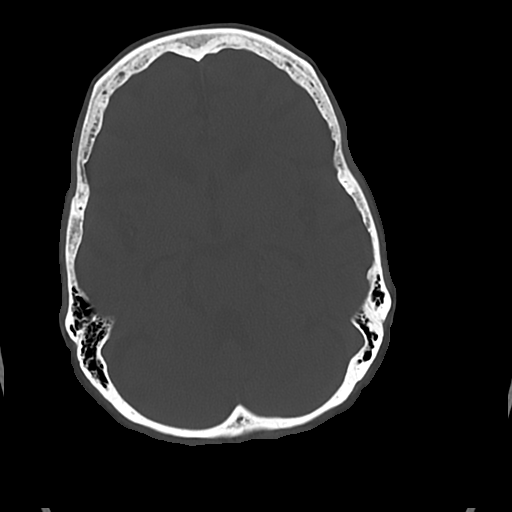

[Series 5: cor soft · coronal · 0.29mm/px · 3 of 68 slices shown]
[im 23/68  brain]
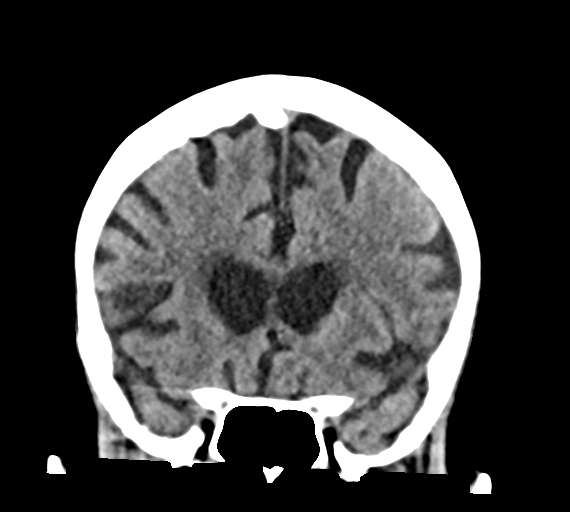
[im 30/68  brain]
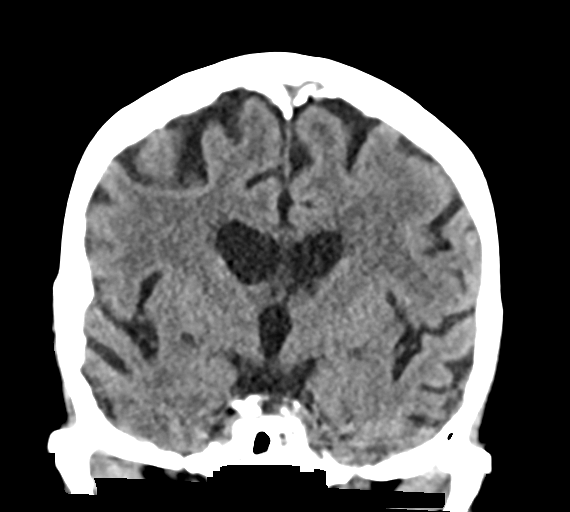
[im 38/68  brain]
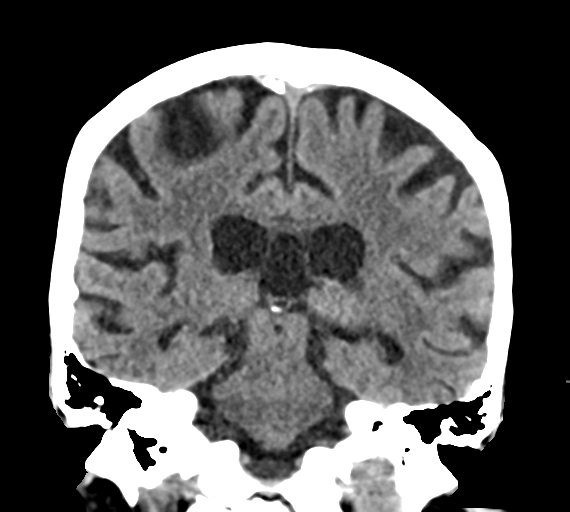

[Series 6: sag soft · sagittal · 0.29mm/px · 3 of 51 slices shown]
[im 17/51  brain]
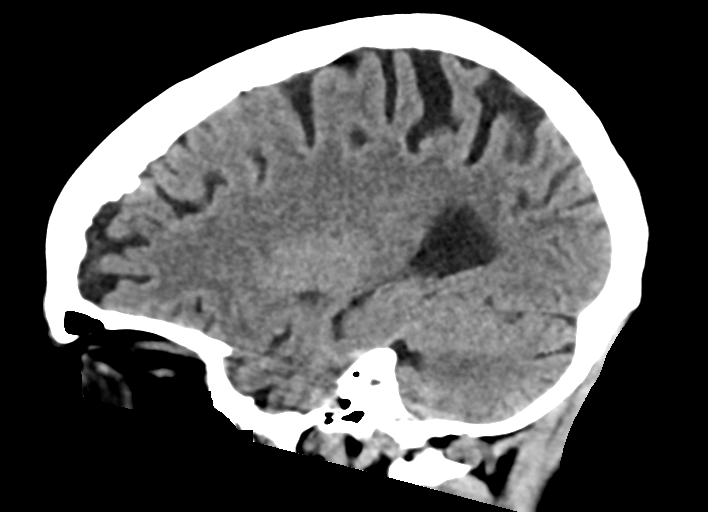
[im 26/51  brain]
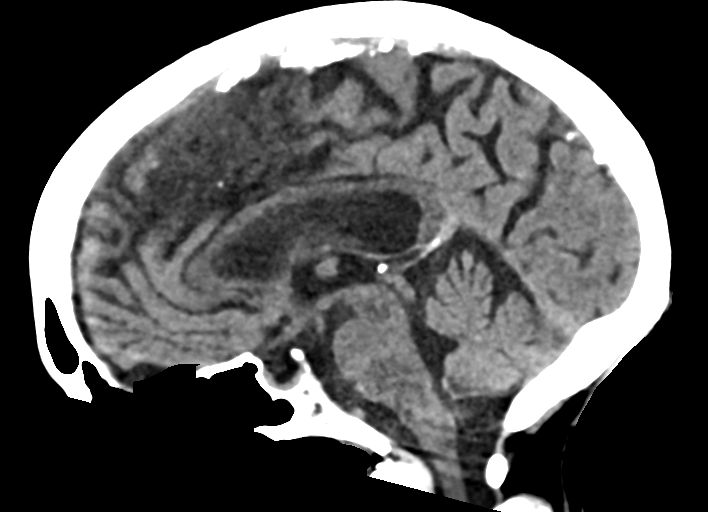
[im 34/51  brain]
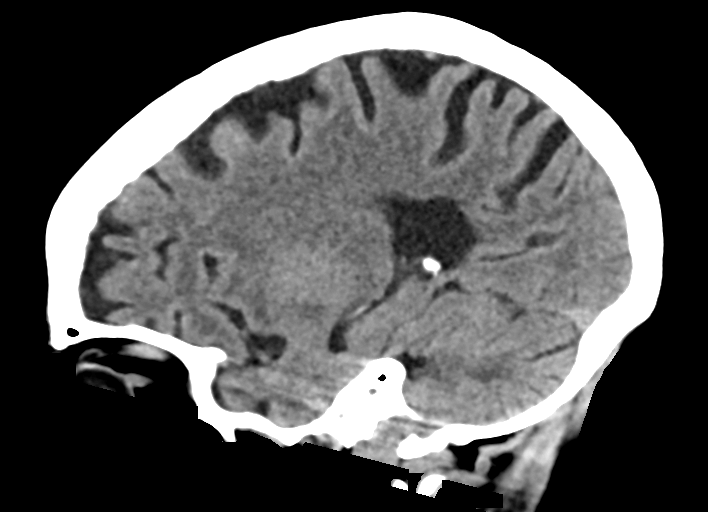

[16 of 47 positions shown; findings below may reference images not displayed]

FINDINGS: Brain: No midline shift, ventriculomegaly, mass effect, evidence of
mass lesion, intracranial hemorrhage or evidence of cortically based
acute infarction.

Cavum vergae, normal variant. Mild for age scattered white matter
hypodensity. Otherwise normal gray-white matter differentiation. No
cortical encephalomalacia identified.

Vascular: Mild Calcified atherosclerosis at the skull base. No
suspicious intracranial vascular hyperdensity.

Skull: No acute osseous abnormality identified.

Sinuses/Orbits: Visualized paranasal sinuses and mastoids are stable
and well pneumatized.

Other: No acute orbit or scalp soft tissue findings.
IMPRESSION: 1. No acute intracranial abnormality.
2. Mild for age cerebral white matter changes, most commonly due to
small vessel disease.

## 2022-02-11 IMAGING — US US RENAL
2 series · 14 of 25 positions shown · non-contrast
Comparison: CT abdomen pelvis 10/23/2019.

CLINICAL DATA: Acute renal injury

EXAM:
RENAL / URINARY TRACT ULTRASOUND COMPLETE

[Series 1: us renal · 13 of 42 slices shown (1 of 2)]
[im 1/42]
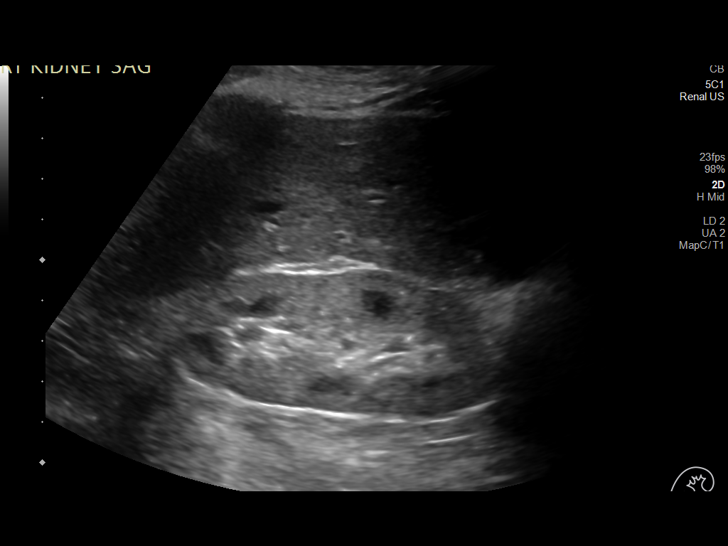
[im 4/42]
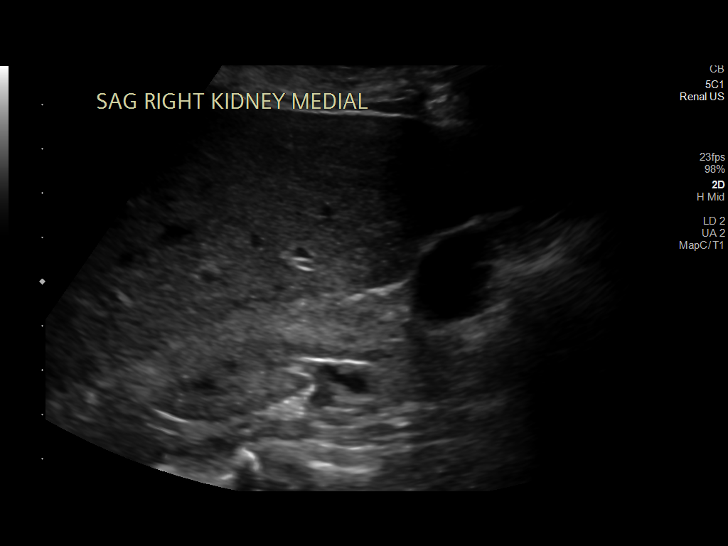
[im 8/42]
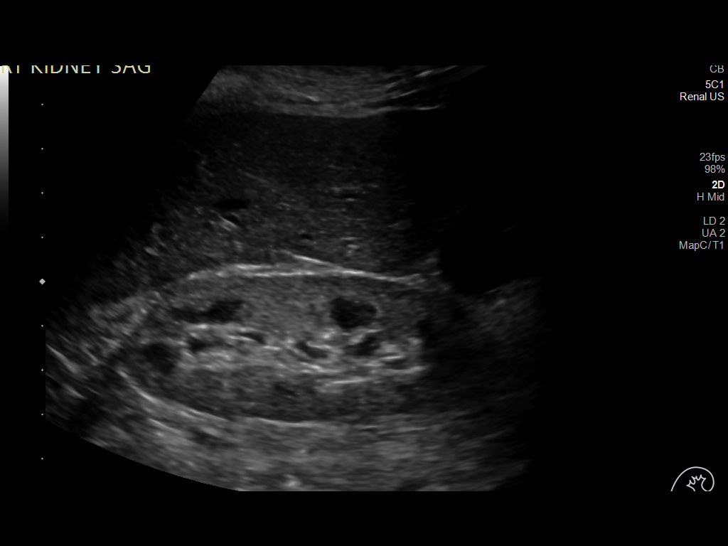
[im 11/42]
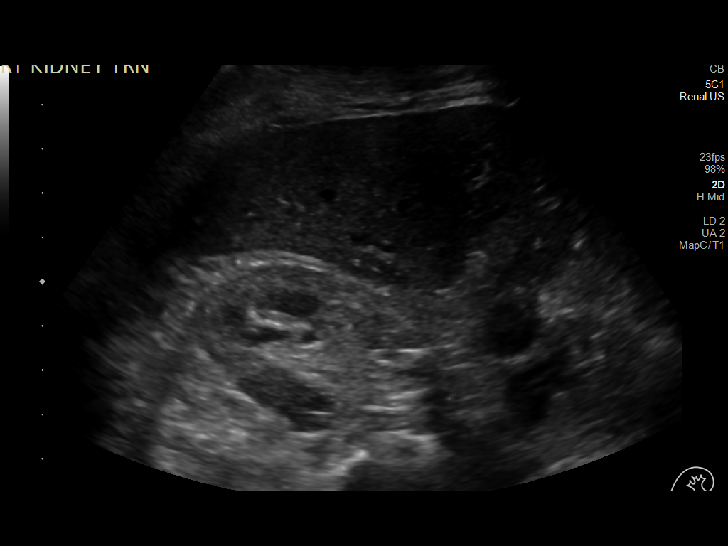
[im 15/42]
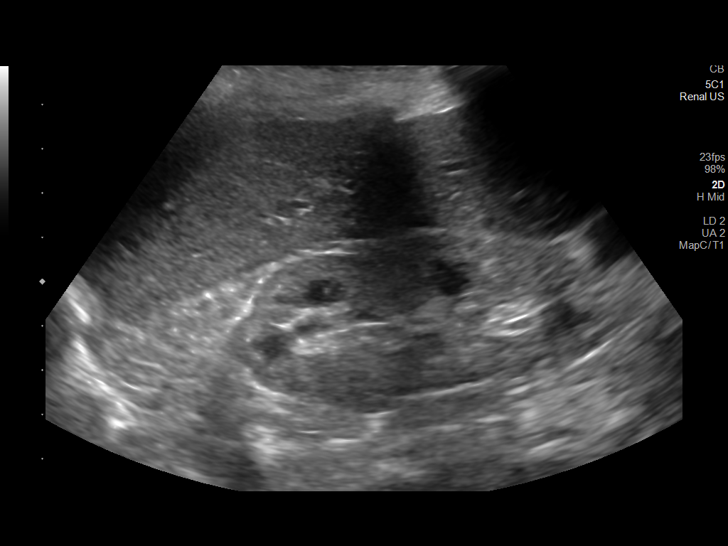
[im 17/42]
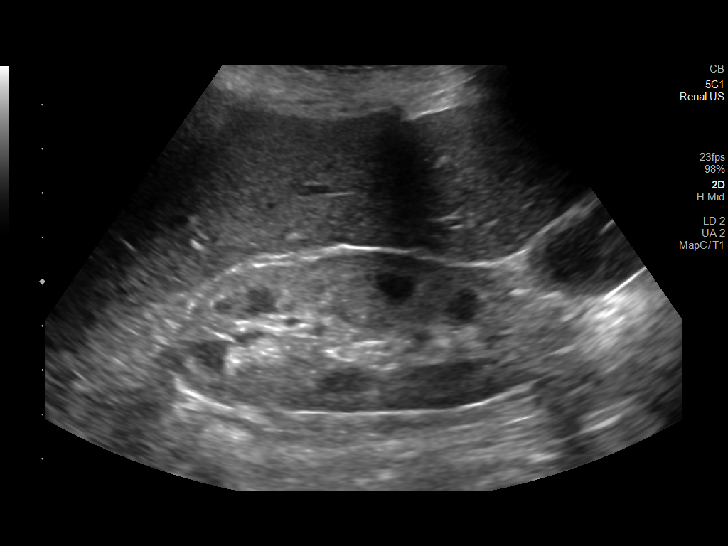
[im 20/42]
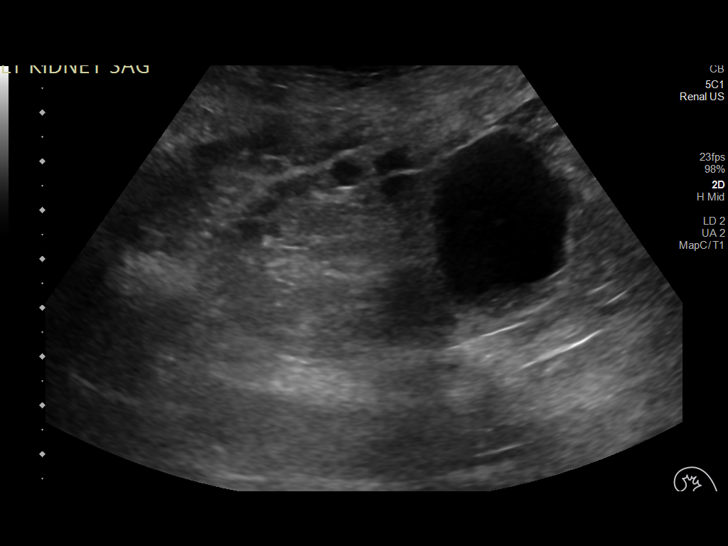
[im 24/42]
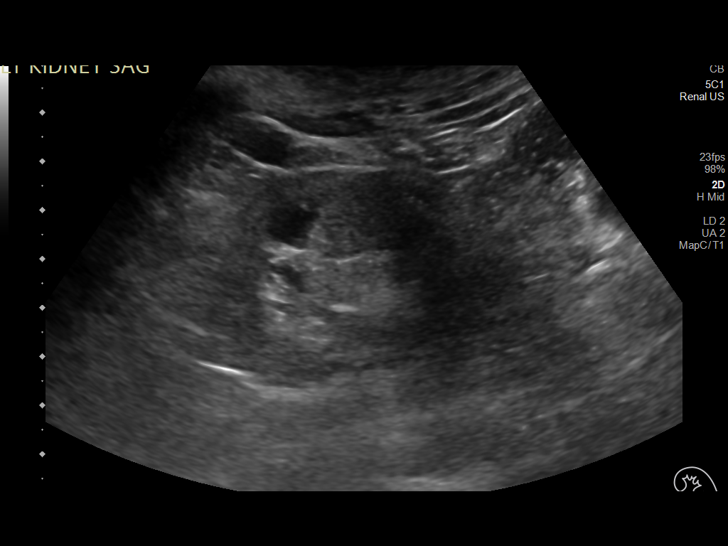
[im 27/42]
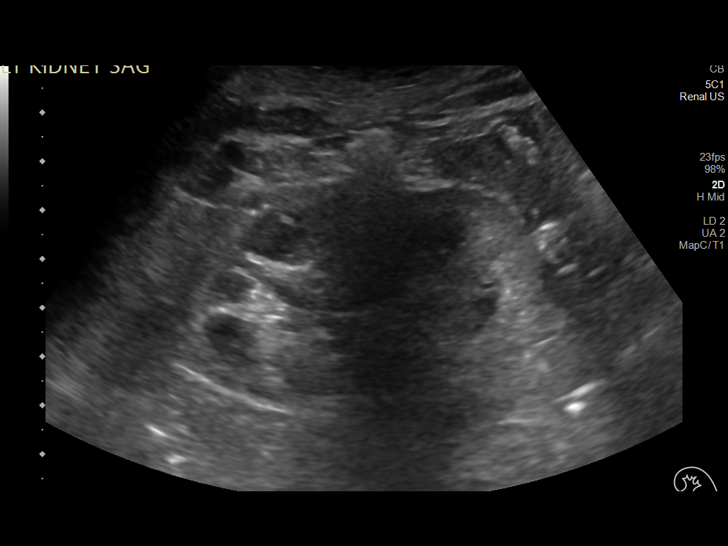
[im 29/42]
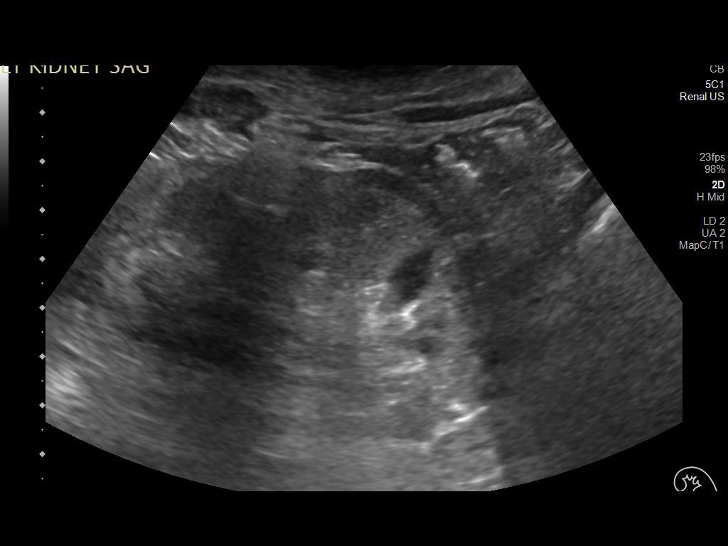
[im 33/42]
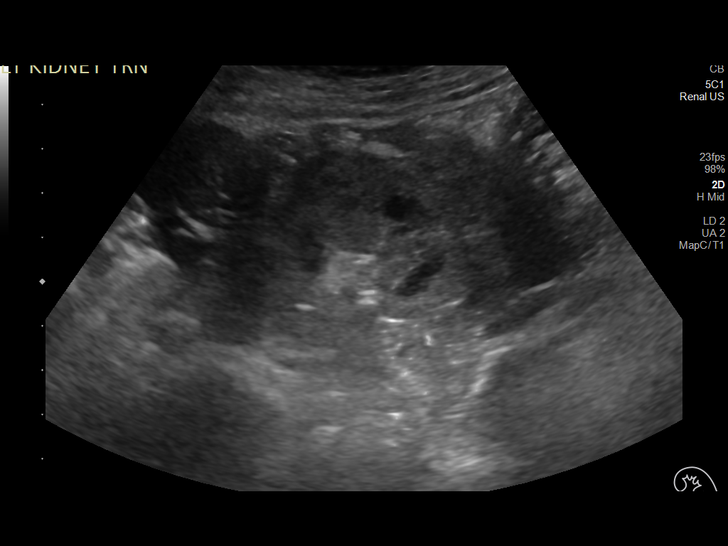
[im 36/42]
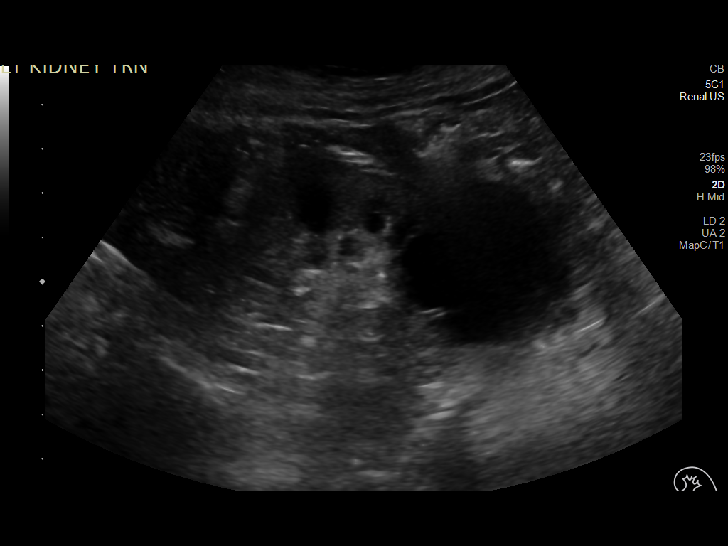
[im 40/42]
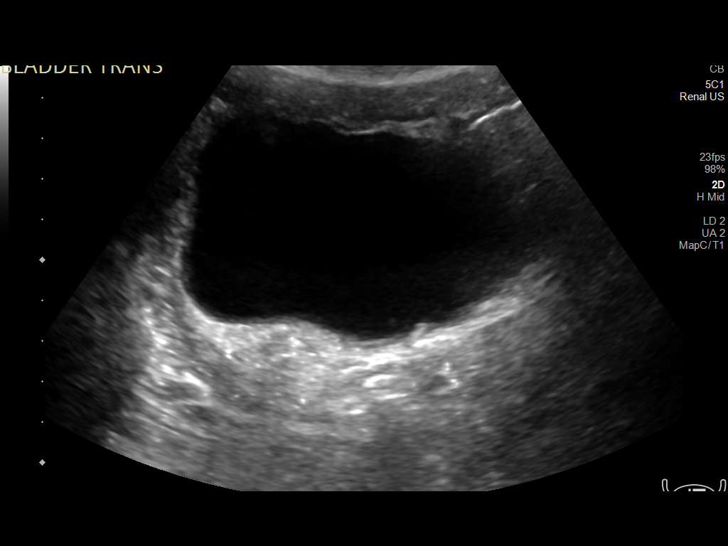

[Series 1002: us renal · 1 of 1 slices shown (2 of 2)]
[im 1/1]
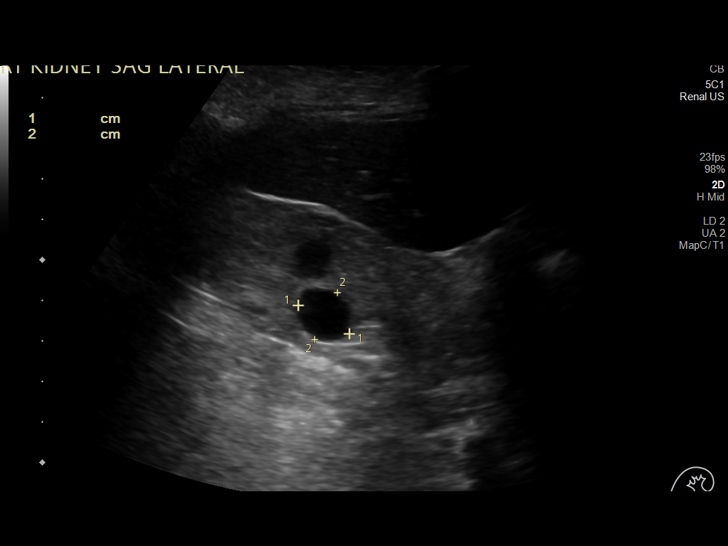

[14 of 25 positions shown; findings below may reference images not displayed]

FINDINGS: Right Kidney:

Renal measurements: 9.4 x 3.4 x 5.1 = volume: 84 mL. Increased
echogenicity with prominent medullary pyramids. There is a 1.5 cm
cystic lesion within the right kidney that is grossly stable in size
compared to prior CT 1414. No solid mass or hydronephrosis
visualized.

Left Kidney:

Renal measurements: 8.5 x 4.4 x 4 = volume: 79 mL. Increased
echogenicity with prominent medullary pyramids. There is a 3.8 cm
cystic lesion within the left kidney that is grossly similar
slightly decreased in size compared to prior CT 1414. No solid mass
or hydronephrosis visualized.

Urinary bladder:

Appears normal for degree of bladder distention.

Other:

None.
IMPRESSION: 1. Bilateral echogenic parenchyma suggestive of chronic renal
disease.
2. Otherwise grossly unremarkable renal ultrasound.

## 2022-04-22 DIAGNOSIS — I10 Essential (primary) hypertension: Secondary | ICD-10-CM | POA: Diagnosis not present

## 2022-04-22 DIAGNOSIS — J45909 Unspecified asthma, uncomplicated: Secondary | ICD-10-CM | POA: Diagnosis not present

## 2022-04-22 DIAGNOSIS — I714 Abdominal aortic aneurysm, without rupture, unspecified: Secondary | ICD-10-CM | POA: Insufficient documentation

## 2022-05-10 DIAGNOSIS — Z6824 Body mass index (BMI) 24.0-24.9, adult: Secondary | ICD-10-CM | POA: Diagnosis not present

## 2022-05-10 DIAGNOSIS — E039 Hypothyroidism, unspecified: Secondary | ICD-10-CM | POA: Diagnosis not present

## 2022-05-10 DIAGNOSIS — R609 Edema, unspecified: Secondary | ICD-10-CM | POA: Diagnosis not present

## 2022-06-24 DIAGNOSIS — D510 Vitamin B12 deficiency anemia due to intrinsic factor deficiency: Secondary | ICD-10-CM | POA: Diagnosis not present

## 2022-07-27 DIAGNOSIS — E538 Deficiency of other specified B group vitamins: Secondary | ICD-10-CM | POA: Diagnosis not present

## 2022-08-12 DIAGNOSIS — B370 Candidal stomatitis: Secondary | ICD-10-CM | POA: Diagnosis not present

## 2022-08-12 DIAGNOSIS — Z0000 Encounter for general adult medical examination without abnormal findings: Secondary | ICD-10-CM | POA: Diagnosis not present

## 2022-08-12 DIAGNOSIS — Z6824 Body mass index (BMI) 24.0-24.9, adult: Secondary | ICD-10-CM | POA: Diagnosis not present

## 2022-08-12 DIAGNOSIS — J45909 Unspecified asthma, uncomplicated: Secondary | ICD-10-CM | POA: Diagnosis not present

## 2022-09-06 DIAGNOSIS — Z6823 Body mass index (BMI) 23.0-23.9, adult: Secondary | ICD-10-CM | POA: Diagnosis not present

## 2022-09-06 DIAGNOSIS — H905 Unspecified sensorineural hearing loss: Secondary | ICD-10-CM | POA: Diagnosis not present

## 2022-09-06 DIAGNOSIS — J00 Acute nasopharyngitis [common cold]: Secondary | ICD-10-CM | POA: Diagnosis not present

## 2022-09-29 DIAGNOSIS — E538 Deficiency of other specified B group vitamins: Secondary | ICD-10-CM | POA: Diagnosis not present

## 2022-09-29 DIAGNOSIS — Z23 Encounter for immunization: Secondary | ICD-10-CM | POA: Diagnosis not present

## 2022-10-18 DIAGNOSIS — I714 Abdominal aortic aneurysm, without rupture, unspecified: Secondary | ICD-10-CM | POA: Diagnosis not present

## 2022-11-15 DIAGNOSIS — M109 Gout, unspecified: Secondary | ICD-10-CM | POA: Diagnosis not present

## 2022-11-15 DIAGNOSIS — E538 Deficiency of other specified B group vitamins: Secondary | ICD-10-CM | POA: Diagnosis not present

## 2022-11-15 DIAGNOSIS — Z23 Encounter for immunization: Secondary | ICD-10-CM | POA: Diagnosis not present

## 2022-11-15 DIAGNOSIS — Z6823 Body mass index (BMI) 23.0-23.9, adult: Secondary | ICD-10-CM | POA: Diagnosis not present

## 2022-11-15 DIAGNOSIS — E039 Hypothyroidism, unspecified: Secondary | ICD-10-CM | POA: Diagnosis not present

## 2022-11-15 DIAGNOSIS — B370 Candidal stomatitis: Secondary | ICD-10-CM | POA: Diagnosis not present

## 2022-11-15 DIAGNOSIS — Z79899 Other long term (current) drug therapy: Secondary | ICD-10-CM | POA: Diagnosis not present

## 2023-02-23 DIAGNOSIS — H52223 Regular astigmatism, bilateral: Secondary | ICD-10-CM | POA: Diagnosis not present

## 2023-02-23 DIAGNOSIS — H524 Presbyopia: Secondary | ICD-10-CM | POA: Diagnosis not present

## 2023-04-26 DIAGNOSIS — I10 Essential (primary) hypertension: Secondary | ICD-10-CM | POA: Diagnosis not present

## 2023-04-26 DIAGNOSIS — Z79899 Other long term (current) drug therapy: Secondary | ICD-10-CM | POA: Diagnosis not present

## 2023-04-26 DIAGNOSIS — E039 Hypothyroidism, unspecified: Secondary | ICD-10-CM | POA: Diagnosis not present

## 2023-04-26 DIAGNOSIS — Z6823 Body mass index (BMI) 23.0-23.9, adult: Secondary | ICD-10-CM | POA: Diagnosis not present

## 2023-04-26 DIAGNOSIS — E538 Deficiency of other specified B group vitamins: Secondary | ICD-10-CM | POA: Diagnosis not present

## 2023-04-27 DIAGNOSIS — H401131 Primary open-angle glaucoma, bilateral, mild stage: Secondary | ICD-10-CM | POA: Diagnosis not present

## 2023-07-01 DIAGNOSIS — I722 Aneurysm of renal artery: Secondary | ICD-10-CM | POA: Diagnosis not present

## 2023-07-01 DIAGNOSIS — I714 Abdominal aortic aneurysm, without rupture, unspecified: Secondary | ICD-10-CM | POA: Diagnosis not present

## 2023-07-01 DIAGNOSIS — I701 Atherosclerosis of renal artery: Secondary | ICD-10-CM | POA: Diagnosis not present

## 2023-07-01 DIAGNOSIS — I7143 Infrarenal abdominal aortic aneurysm, without rupture: Secondary | ICD-10-CM | POA: Diagnosis not present

## 2023-07-01 DIAGNOSIS — I723 Aneurysm of iliac artery: Secondary | ICD-10-CM | POA: Diagnosis not present

## 2023-07-01 DIAGNOSIS — K625 Hemorrhage of anus and rectum: Secondary | ICD-10-CM | POA: Diagnosis not present

## 2023-07-07 ENCOUNTER — Telehealth: Payer: Self-pay

## 2023-07-07 NOTE — Telephone Encounter (Signed)
Transition Care Management Unsuccessful Follow-up Telephone Call  Date of discharge and from where:  [facility] 7/27  Attempts:  1st Attempt  Reason for unsuccessful TCM follow-up call:  No answer/busy   Lenard For[facility] Guide, [facility] [phone] [address] [place], [place], [place] [address] Phone: [phone] Email: [email or web address]

## 2023-07-07 NOTE — Telephone Encounter (Signed)
Transition Care Management Unsuccessful Follow-up Telephone Call  Date of discharge and from where:  [facility] 7/27  Attempts:  2nd Attempt  Reason for unsuccessful TCM follow-up call:  No answer/busy   Lenard Forth [facility] Guide, [facility] [phone] [address] [place], [place], [place] [address] Phone: [phone] Email: [email or web address]

## 2023-09-01 DIAGNOSIS — H401131 Primary open-angle glaucoma, bilateral, mild stage: Secondary | ICD-10-CM | POA: Diagnosis not present

## 2023-10-18 DIAGNOSIS — Z23 Encounter for immunization: Secondary | ICD-10-CM | POA: Diagnosis not present

## 2023-10-18 DIAGNOSIS — J329 Chronic sinusitis, unspecified: Secondary | ICD-10-CM | POA: Diagnosis not present

## 2023-10-18 DIAGNOSIS — Z0000 Encounter for general adult medical examination without abnormal findings: Secondary | ICD-10-CM | POA: Diagnosis not present

## 2023-10-18 DIAGNOSIS — E039 Hypothyroidism, unspecified: Secondary | ICD-10-CM | POA: Diagnosis not present

## 2023-10-18 DIAGNOSIS — Z6822 Body mass index (BMI) 22.0-22.9, adult: Secondary | ICD-10-CM | POA: Diagnosis not present

## 2023-10-18 DIAGNOSIS — Z79899 Other long term (current) drug therapy: Secondary | ICD-10-CM | POA: Diagnosis not present

## 2023-10-19 DIAGNOSIS — D519 Vitamin B12 deficiency anemia, unspecified: Secondary | ICD-10-CM | POA: Diagnosis not present

## 2023-11-27 DIAGNOSIS — K5731 Diverticulosis of large intestine without perforation or abscess with bleeding: Secondary | ICD-10-CM | POA: Diagnosis not present

## 2023-11-27 DIAGNOSIS — R739 Hyperglycemia, unspecified: Secondary | ICD-10-CM | POA: Diagnosis not present

## 2023-11-27 DIAGNOSIS — M19042 Primary osteoarthritis, left hand: Secondary | ICD-10-CM | POA: Diagnosis not present

## 2023-11-27 DIAGNOSIS — D649 Anemia, unspecified: Secondary | ICD-10-CM | POA: Diagnosis not present

## 2023-11-27 DIAGNOSIS — K922 Gastrointestinal hemorrhage, unspecified: Secondary | ICD-10-CM | POA: Diagnosis not present

## 2023-11-27 DIAGNOSIS — E039 Hypothyroidism, unspecified: Secondary | ICD-10-CM | POA: Diagnosis not present

## 2023-11-27 DIAGNOSIS — R9431 Abnormal electrocardiogram [ECG] [EKG]: Secondary | ICD-10-CM | POA: Diagnosis not present

## 2023-11-27 DIAGNOSIS — Z87891 Personal history of nicotine dependence: Secondary | ICD-10-CM | POA: Diagnosis not present

## 2023-11-27 DIAGNOSIS — K5791 Diverticulosis of intestine, part unspecified, without perforation or abscess with bleeding: Secondary | ICD-10-CM | POA: Diagnosis not present

## 2023-11-27 DIAGNOSIS — K219 Gastro-esophageal reflux disease without esophagitis: Secondary | ICD-10-CM | POA: Diagnosis not present

## 2023-11-27 DIAGNOSIS — M19041 Primary osteoarthritis, right hand: Secondary | ICD-10-CM | POA: Diagnosis not present

## 2023-11-27 DIAGNOSIS — J449 Chronic obstructive pulmonary disease, unspecified: Secondary | ICD-10-CM | POA: Diagnosis not present

## 2023-11-27 DIAGNOSIS — I10 Essential (primary) hypertension: Secondary | ICD-10-CM | POA: Diagnosis not present

## 2023-11-27 DIAGNOSIS — Z79899 Other long term (current) drug therapy: Secondary | ICD-10-CM | POA: Diagnosis not present

## 2023-11-27 DIAGNOSIS — R55 Syncope and collapse: Secondary | ICD-10-CM | POA: Diagnosis not present

## 2023-11-28 DIAGNOSIS — I10 Essential (primary) hypertension: Secondary | ICD-10-CM | POA: Diagnosis not present

## 2023-11-28 DIAGNOSIS — K922 Gastrointestinal hemorrhage, unspecified: Secondary | ICD-10-CM | POA: Diagnosis not present

## 2023-11-28 DIAGNOSIS — K5790 Diverticulosis of intestine, part unspecified, without perforation or abscess without bleeding: Secondary | ICD-10-CM | POA: Diagnosis not present

## 2023-11-29 DIAGNOSIS — I10 Essential (primary) hypertension: Secondary | ICD-10-CM | POA: Diagnosis not present

## 2023-11-29 DIAGNOSIS — K5790 Diverticulosis of intestine, part unspecified, without perforation or abscess without bleeding: Secondary | ICD-10-CM | POA: Diagnosis not present

## 2023-11-29 DIAGNOSIS — K922 Gastrointestinal hemorrhage, unspecified: Secondary | ICD-10-CM | POA: Diagnosis not present

## 2023-12-10 DIAGNOSIS — R9431 Abnormal electrocardiogram [ECG] [EKG]: Secondary | ICD-10-CM | POA: Diagnosis not present

## 2023-12-10 DIAGNOSIS — R1310 Dysphagia, unspecified: Secondary | ICD-10-CM | POA: Diagnosis not present

## 2023-12-10 DIAGNOSIS — I10 Essential (primary) hypertension: Secondary | ICD-10-CM | POA: Diagnosis not present

## 2023-12-14 DIAGNOSIS — R1310 Dysphagia, unspecified: Secondary | ICD-10-CM | POA: Diagnosis not present

## 2023-12-14 DIAGNOSIS — Z6821 Body mass index (BMI) 21.0-21.9, adult: Secondary | ICD-10-CM | POA: Diagnosis not present

## 2024-02-21 DIAGNOSIS — D519 Vitamin B12 deficiency anemia, unspecified: Secondary | ICD-10-CM | POA: Diagnosis not present

## 2024-02-27 DIAGNOSIS — Z6821 Body mass index (BMI) 21.0-21.9, adult: Secondary | ICD-10-CM | POA: Diagnosis not present

## 2024-02-27 DIAGNOSIS — I10 Essential (primary) hypertension: Secondary | ICD-10-CM | POA: Diagnosis not present

## 2024-02-27 DIAGNOSIS — E039 Hypothyroidism, unspecified: Secondary | ICD-10-CM | POA: Diagnosis not present

## 2024-06-19 DIAGNOSIS — Z681 Body mass index (BMI) 19 or less, adult: Secondary | ICD-10-CM | POA: Diagnosis not present

## 2024-06-19 DIAGNOSIS — I10 Essential (primary) hypertension: Secondary | ICD-10-CM | POA: Diagnosis not present

## 2024-06-19 DIAGNOSIS — D519 Vitamin B12 deficiency anemia, unspecified: Secondary | ICD-10-CM | POA: Diagnosis not present

## 2024-07-03 DIAGNOSIS — I161 Hypertensive emergency: Secondary | ICD-10-CM | POA: Diagnosis not present

## 2024-07-03 DIAGNOSIS — Z79899 Other long term (current) drug therapy: Secondary | ICD-10-CM | POA: Diagnosis not present

## 2024-07-03 DIAGNOSIS — R9431 Abnormal electrocardiogram [ECG] [EKG]: Secondary | ICD-10-CM | POA: Diagnosis not present

## 2024-07-03 DIAGNOSIS — Z681 Body mass index (BMI) 19 or less, adult: Secondary | ICD-10-CM | POA: Diagnosis not present

## 2024-07-03 DIAGNOSIS — I43 Cardiomyopathy in diseases classified elsewhere: Secondary | ICD-10-CM | POA: Diagnosis not present

## 2024-07-03 DIAGNOSIS — I714 Abdominal aortic aneurysm, without rupture, unspecified: Secondary | ICD-10-CM | POA: Diagnosis not present

## 2024-07-03 DIAGNOSIS — M19042 Primary osteoarthritis, left hand: Secondary | ICD-10-CM | POA: Diagnosis not present

## 2024-07-03 DIAGNOSIS — I351 Nonrheumatic aortic (valve) insufficiency: Secondary | ICD-10-CM | POA: Diagnosis not present

## 2024-07-03 DIAGNOSIS — I160 Hypertensive urgency: Secondary | ICD-10-CM | POA: Diagnosis not present

## 2024-07-03 DIAGNOSIS — Z7951 Long term (current) use of inhaled steroids: Secondary | ICD-10-CM | POA: Diagnosis not present

## 2024-07-03 DIAGNOSIS — J449 Chronic obstructive pulmonary disease, unspecified: Secondary | ICD-10-CM | POA: Diagnosis not present

## 2024-07-03 DIAGNOSIS — R64 Cachexia: Secondary | ICD-10-CM | POA: Diagnosis not present

## 2024-07-03 DIAGNOSIS — N183 Chronic kidney disease, stage 3 unspecified: Secondary | ICD-10-CM | POA: Diagnosis not present

## 2024-07-03 DIAGNOSIS — I5033 Acute on chronic diastolic (congestive) heart failure: Secondary | ICD-10-CM | POA: Diagnosis not present

## 2024-07-03 DIAGNOSIS — A419 Sepsis, unspecified organism: Secondary | ICD-10-CM | POA: Diagnosis not present

## 2024-07-03 DIAGNOSIS — I10 Essential (primary) hypertension: Secondary | ICD-10-CM | POA: Diagnosis not present

## 2024-07-03 DIAGNOSIS — I50811 Acute right heart failure: Secondary | ICD-10-CM | POA: Diagnosis not present

## 2024-07-03 DIAGNOSIS — E854 Organ-limited amyloidosis: Secondary | ICD-10-CM | POA: Diagnosis not present

## 2024-07-03 DIAGNOSIS — N179 Acute kidney failure, unspecified: Secondary | ICD-10-CM | POA: Diagnosis not present

## 2024-07-03 DIAGNOSIS — R531 Weakness: Secondary | ICD-10-CM | POA: Diagnosis not present

## 2024-07-03 DIAGNOSIS — K219 Gastro-esophageal reflux disease without esophagitis: Secondary | ICD-10-CM | POA: Diagnosis not present

## 2024-07-03 DIAGNOSIS — M19041 Primary osteoarthritis, right hand: Secondary | ICD-10-CM | POA: Diagnosis not present

## 2024-07-03 DIAGNOSIS — I509 Heart failure, unspecified: Secondary | ICD-10-CM | POA: Diagnosis not present

## 2024-07-03 DIAGNOSIS — D631 Anemia in chronic kidney disease: Secondary | ICD-10-CM | POA: Diagnosis not present

## 2024-07-03 DIAGNOSIS — E039 Hypothyroidism, unspecified: Secondary | ICD-10-CM | POA: Diagnosis not present

## 2024-07-03 DIAGNOSIS — I130 Hypertensive heart and chronic kidney disease with heart failure and stage 1 through stage 4 chronic kidney disease, or unspecified chronic kidney disease: Secondary | ICD-10-CM | POA: Diagnosis not present

## 2024-07-03 DIAGNOSIS — N1832 Chronic kidney disease, stage 3b: Secondary | ICD-10-CM | POA: Diagnosis not present

## 2024-07-03 DIAGNOSIS — I517 Cardiomegaly: Secondary | ICD-10-CM | POA: Diagnosis not present

## 2024-07-04 DIAGNOSIS — I517 Cardiomegaly: Secondary | ICD-10-CM | POA: Diagnosis not present

## 2024-07-04 DIAGNOSIS — I351 Nonrheumatic aortic (valve) insufficiency: Secondary | ICD-10-CM | POA: Diagnosis not present

## 2024-07-06 DIAGNOSIS — I5033 Acute on chronic diastolic (congestive) heart failure: Secondary | ICD-10-CM | POA: Diagnosis not present

## 2024-07-06 DIAGNOSIS — N179 Acute kidney failure, unspecified: Secondary | ICD-10-CM | POA: Diagnosis not present

## 2024-07-06 DIAGNOSIS — I161 Hypertensive emergency: Secondary | ICD-10-CM | POA: Diagnosis not present

## 2024-07-07 DIAGNOSIS — I161 Hypertensive emergency: Secondary | ICD-10-CM | POA: Diagnosis not present

## 2024-07-07 DIAGNOSIS — N179 Acute kidney failure, unspecified: Secondary | ICD-10-CM | POA: Diagnosis not present

## 2024-07-07 DIAGNOSIS — I5033 Acute on chronic diastolic (congestive) heart failure: Secondary | ICD-10-CM | POA: Diagnosis not present

## 2024-07-08 DIAGNOSIS — I5033 Acute on chronic diastolic (congestive) heart failure: Secondary | ICD-10-CM | POA: Diagnosis not present

## 2024-07-08 DIAGNOSIS — I10 Essential (primary) hypertension: Secondary | ICD-10-CM | POA: Diagnosis not present

## 2024-07-08 DIAGNOSIS — N1832 Chronic kidney disease, stage 3b: Secondary | ICD-10-CM | POA: Diagnosis not present

## 2024-07-10 DIAGNOSIS — D500 Iron deficiency anemia secondary to blood loss (chronic): Secondary | ICD-10-CM | POA: Diagnosis not present

## 2024-07-10 DIAGNOSIS — J4489 Other specified chronic obstructive pulmonary disease: Secondary | ICD-10-CM | POA: Diagnosis not present

## 2024-07-10 DIAGNOSIS — E782 Mixed hyperlipidemia: Secondary | ICD-10-CM | POA: Diagnosis not present

## 2024-07-10 DIAGNOSIS — I4510 Unspecified right bundle-branch block: Secondary | ICD-10-CM | POA: Diagnosis not present

## 2024-07-10 DIAGNOSIS — E854 Organ-limited amyloidosis: Secondary | ICD-10-CM | POA: Diagnosis not present

## 2024-07-10 DIAGNOSIS — E039 Hypothyroidism, unspecified: Secondary | ICD-10-CM | POA: Diagnosis not present

## 2024-07-10 DIAGNOSIS — D631 Anemia in chronic kidney disease: Secondary | ICD-10-CM | POA: Diagnosis not present

## 2024-07-10 DIAGNOSIS — M19042 Primary osteoarthritis, left hand: Secondary | ICD-10-CM | POA: Diagnosis not present

## 2024-07-10 DIAGNOSIS — M19041 Primary osteoarthritis, right hand: Secondary | ICD-10-CM | POA: Diagnosis not present

## 2024-07-10 DIAGNOSIS — D519 Vitamin B12 deficiency anemia, unspecified: Secondary | ICD-10-CM | POA: Diagnosis not present

## 2024-07-10 DIAGNOSIS — H409 Unspecified glaucoma: Secondary | ICD-10-CM | POA: Diagnosis not present

## 2024-07-10 DIAGNOSIS — I714 Abdominal aortic aneurysm, without rupture, unspecified: Secondary | ICD-10-CM | POA: Diagnosis not present

## 2024-07-10 DIAGNOSIS — K219 Gastro-esophageal reflux disease without esophagitis: Secondary | ICD-10-CM | POA: Diagnosis not present

## 2024-07-10 DIAGNOSIS — N1832 Chronic kidney disease, stage 3b: Secondary | ICD-10-CM | POA: Diagnosis not present

## 2024-07-10 DIAGNOSIS — M109 Gout, unspecified: Secondary | ICD-10-CM | POA: Diagnosis not present

## 2024-07-10 DIAGNOSIS — I5033 Acute on chronic diastolic (congestive) heart failure: Secondary | ICD-10-CM | POA: Diagnosis not present

## 2024-07-10 DIAGNOSIS — I130 Hypertensive heart and chronic kidney disease with heart failure and stage 1 through stage 4 chronic kidney disease, or unspecified chronic kidney disease: Secondary | ICD-10-CM | POA: Diagnosis not present

## 2024-07-10 DIAGNOSIS — K5730 Diverticulosis of large intestine without perforation or abscess without bleeding: Secondary | ICD-10-CM | POA: Diagnosis not present

## 2024-07-10 DIAGNOSIS — I43 Cardiomyopathy in diseases classified elsewhere: Secondary | ICD-10-CM | POA: Diagnosis not present

## 2024-07-10 DIAGNOSIS — I161 Hypertensive emergency: Secondary | ICD-10-CM | POA: Diagnosis not present

## 2024-07-10 DIAGNOSIS — E46 Unspecified protein-calorie malnutrition: Secondary | ICD-10-CM | POA: Diagnosis not present

## 2024-07-10 DIAGNOSIS — I083 Combined rheumatic disorders of mitral, aortic and tricuspid valves: Secondary | ICD-10-CM | POA: Diagnosis not present

## 2024-07-11 DIAGNOSIS — I161 Hypertensive emergency: Secondary | ICD-10-CM | POA: Diagnosis not present

## 2024-07-11 DIAGNOSIS — I083 Combined rheumatic disorders of mitral, aortic and tricuspid valves: Secondary | ICD-10-CM | POA: Diagnosis not present

## 2024-07-11 DIAGNOSIS — N1832 Chronic kidney disease, stage 3b: Secondary | ICD-10-CM | POA: Diagnosis not present

## 2024-07-11 DIAGNOSIS — D519 Vitamin B12 deficiency anemia, unspecified: Secondary | ICD-10-CM | POA: Diagnosis not present

## 2024-07-11 DIAGNOSIS — I130 Hypertensive heart and chronic kidney disease with heart failure and stage 1 through stage 4 chronic kidney disease, or unspecified chronic kidney disease: Secondary | ICD-10-CM | POA: Diagnosis not present

## 2024-07-11 DIAGNOSIS — I43 Cardiomyopathy in diseases classified elsewhere: Secondary | ICD-10-CM | POA: Diagnosis not present

## 2024-07-11 DIAGNOSIS — M19042 Primary osteoarthritis, left hand: Secondary | ICD-10-CM | POA: Diagnosis not present

## 2024-07-11 DIAGNOSIS — K219 Gastro-esophageal reflux disease without esophagitis: Secondary | ICD-10-CM | POA: Diagnosis not present

## 2024-07-11 DIAGNOSIS — M109 Gout, unspecified: Secondary | ICD-10-CM | POA: Diagnosis not present

## 2024-07-11 DIAGNOSIS — D631 Anemia in chronic kidney disease: Secondary | ICD-10-CM | POA: Diagnosis not present

## 2024-07-11 DIAGNOSIS — J4489 Other specified chronic obstructive pulmonary disease: Secondary | ICD-10-CM | POA: Diagnosis not present

## 2024-07-11 DIAGNOSIS — E46 Unspecified protein-calorie malnutrition: Secondary | ICD-10-CM | POA: Diagnosis not present

## 2024-07-11 DIAGNOSIS — I4510 Unspecified right bundle-branch block: Secondary | ICD-10-CM | POA: Diagnosis not present

## 2024-07-11 DIAGNOSIS — E854 Organ-limited amyloidosis: Secondary | ICD-10-CM | POA: Diagnosis not present

## 2024-07-11 DIAGNOSIS — M19041 Primary osteoarthritis, right hand: Secondary | ICD-10-CM | POA: Diagnosis not present

## 2024-07-11 DIAGNOSIS — H409 Unspecified glaucoma: Secondary | ICD-10-CM | POA: Diagnosis not present

## 2024-07-11 DIAGNOSIS — I5033 Acute on chronic diastolic (congestive) heart failure: Secondary | ICD-10-CM | POA: Diagnosis not present

## 2024-07-11 DIAGNOSIS — D500 Iron deficiency anemia secondary to blood loss (chronic): Secondary | ICD-10-CM | POA: Diagnosis not present

## 2024-07-11 DIAGNOSIS — E039 Hypothyroidism, unspecified: Secondary | ICD-10-CM | POA: Diagnosis not present

## 2024-07-11 DIAGNOSIS — E782 Mixed hyperlipidemia: Secondary | ICD-10-CM | POA: Diagnosis not present

## 2024-07-11 DIAGNOSIS — I714 Abdominal aortic aneurysm, without rupture, unspecified: Secondary | ICD-10-CM | POA: Diagnosis not present

## 2024-07-11 DIAGNOSIS — K5730 Diverticulosis of large intestine without perforation or abscess without bleeding: Secondary | ICD-10-CM | POA: Diagnosis not present

## 2024-07-12 DIAGNOSIS — I083 Combined rheumatic disorders of mitral, aortic and tricuspid valves: Secondary | ICD-10-CM | POA: Diagnosis not present

## 2024-07-12 DIAGNOSIS — E039 Hypothyroidism, unspecified: Secondary | ICD-10-CM | POA: Diagnosis not present

## 2024-07-12 DIAGNOSIS — K219 Gastro-esophageal reflux disease without esophagitis: Secondary | ICD-10-CM | POA: Diagnosis not present

## 2024-07-12 DIAGNOSIS — M19041 Primary osteoarthritis, right hand: Secondary | ICD-10-CM | POA: Diagnosis not present

## 2024-07-12 DIAGNOSIS — N1832 Chronic kidney disease, stage 3b: Secondary | ICD-10-CM | POA: Diagnosis not present

## 2024-07-12 DIAGNOSIS — D500 Iron deficiency anemia secondary to blood loss (chronic): Secondary | ICD-10-CM | POA: Diagnosis not present

## 2024-07-12 DIAGNOSIS — D519 Vitamin B12 deficiency anemia, unspecified: Secondary | ICD-10-CM | POA: Diagnosis not present

## 2024-07-12 DIAGNOSIS — I43 Cardiomyopathy in diseases classified elsewhere: Secondary | ICD-10-CM | POA: Diagnosis not present

## 2024-07-12 DIAGNOSIS — K5730 Diverticulosis of large intestine without perforation or abscess without bleeding: Secondary | ICD-10-CM | POA: Diagnosis not present

## 2024-07-12 DIAGNOSIS — E854 Organ-limited amyloidosis: Secondary | ICD-10-CM | POA: Diagnosis not present

## 2024-07-12 DIAGNOSIS — H409 Unspecified glaucoma: Secondary | ICD-10-CM | POA: Diagnosis not present

## 2024-07-12 DIAGNOSIS — I5033 Acute on chronic diastolic (congestive) heart failure: Secondary | ICD-10-CM | POA: Diagnosis not present

## 2024-07-12 DIAGNOSIS — I714 Abdominal aortic aneurysm, without rupture, unspecified: Secondary | ICD-10-CM | POA: Diagnosis not present

## 2024-07-12 DIAGNOSIS — E46 Unspecified protein-calorie malnutrition: Secondary | ICD-10-CM | POA: Diagnosis not present

## 2024-07-12 DIAGNOSIS — I130 Hypertensive heart and chronic kidney disease with heart failure and stage 1 through stage 4 chronic kidney disease, or unspecified chronic kidney disease: Secondary | ICD-10-CM | POA: Diagnosis not present

## 2024-07-12 DIAGNOSIS — J4489 Other specified chronic obstructive pulmonary disease: Secondary | ICD-10-CM | POA: Diagnosis not present

## 2024-07-12 DIAGNOSIS — M109 Gout, unspecified: Secondary | ICD-10-CM | POA: Diagnosis not present

## 2024-07-12 DIAGNOSIS — I161 Hypertensive emergency: Secondary | ICD-10-CM | POA: Diagnosis not present

## 2024-07-12 DIAGNOSIS — E782 Mixed hyperlipidemia: Secondary | ICD-10-CM | POA: Diagnosis not present

## 2024-07-12 DIAGNOSIS — I4510 Unspecified right bundle-branch block: Secondary | ICD-10-CM | POA: Diagnosis not present

## 2024-07-12 DIAGNOSIS — M19042 Primary osteoarthritis, left hand: Secondary | ICD-10-CM | POA: Diagnosis not present

## 2024-07-12 DIAGNOSIS — D631 Anemia in chronic kidney disease: Secondary | ICD-10-CM | POA: Diagnosis not present

## 2024-07-15 DIAGNOSIS — K219 Gastro-esophageal reflux disease without esophagitis: Secondary | ICD-10-CM | POA: Diagnosis not present

## 2024-07-15 DIAGNOSIS — M19042 Primary osteoarthritis, left hand: Secondary | ICD-10-CM | POA: Diagnosis not present

## 2024-07-15 DIAGNOSIS — M109 Gout, unspecified: Secondary | ICD-10-CM | POA: Diagnosis not present

## 2024-07-15 DIAGNOSIS — I130 Hypertensive heart and chronic kidney disease with heart failure and stage 1 through stage 4 chronic kidney disease, or unspecified chronic kidney disease: Secondary | ICD-10-CM | POA: Diagnosis not present

## 2024-07-15 DIAGNOSIS — H409 Unspecified glaucoma: Secondary | ICD-10-CM | POA: Diagnosis not present

## 2024-07-15 DIAGNOSIS — E039 Hypothyroidism, unspecified: Secondary | ICD-10-CM | POA: Diagnosis not present

## 2024-07-15 DIAGNOSIS — N1832 Chronic kidney disease, stage 3b: Secondary | ICD-10-CM | POA: Diagnosis not present

## 2024-07-15 DIAGNOSIS — D500 Iron deficiency anemia secondary to blood loss (chronic): Secondary | ICD-10-CM | POA: Diagnosis not present

## 2024-07-15 DIAGNOSIS — K5730 Diverticulosis of large intestine without perforation or abscess without bleeding: Secondary | ICD-10-CM | POA: Diagnosis not present

## 2024-07-15 DIAGNOSIS — M19041 Primary osteoarthritis, right hand: Secondary | ICD-10-CM | POA: Diagnosis not present

## 2024-07-15 DIAGNOSIS — D519 Vitamin B12 deficiency anemia, unspecified: Secondary | ICD-10-CM | POA: Diagnosis not present

## 2024-07-15 DIAGNOSIS — J4489 Other specified chronic obstructive pulmonary disease: Secondary | ICD-10-CM | POA: Diagnosis not present

## 2024-07-15 DIAGNOSIS — I4510 Unspecified right bundle-branch block: Secondary | ICD-10-CM | POA: Diagnosis not present

## 2024-07-15 DIAGNOSIS — D631 Anemia in chronic kidney disease: Secondary | ICD-10-CM | POA: Diagnosis not present

## 2024-07-15 DIAGNOSIS — E854 Organ-limited amyloidosis: Secondary | ICD-10-CM | POA: Diagnosis not present

## 2024-07-15 DIAGNOSIS — I43 Cardiomyopathy in diseases classified elsewhere: Secondary | ICD-10-CM | POA: Diagnosis not present

## 2024-07-15 DIAGNOSIS — I161 Hypertensive emergency: Secondary | ICD-10-CM | POA: Diagnosis not present

## 2024-07-15 DIAGNOSIS — I5033 Acute on chronic diastolic (congestive) heart failure: Secondary | ICD-10-CM | POA: Diagnosis not present

## 2024-07-15 DIAGNOSIS — I714 Abdominal aortic aneurysm, without rupture, unspecified: Secondary | ICD-10-CM | POA: Diagnosis not present

## 2024-07-15 DIAGNOSIS — I083 Combined rheumatic disorders of mitral, aortic and tricuspid valves: Secondary | ICD-10-CM | POA: Diagnosis not present

## 2024-07-15 DIAGNOSIS — I509 Heart failure, unspecified: Secondary | ICD-10-CM | POA: Diagnosis not present

## 2024-07-15 DIAGNOSIS — E782 Mixed hyperlipidemia: Secondary | ICD-10-CM | POA: Diagnosis not present

## 2024-07-15 DIAGNOSIS — E46 Unspecified protein-calorie malnutrition: Secondary | ICD-10-CM | POA: Diagnosis not present

## 2024-07-15 DIAGNOSIS — J449 Chronic obstructive pulmonary disease, unspecified: Secondary | ICD-10-CM | POA: Diagnosis not present

## 2024-07-16 DIAGNOSIS — I4510 Unspecified right bundle-branch block: Secondary | ICD-10-CM | POA: Diagnosis not present

## 2024-07-16 DIAGNOSIS — K219 Gastro-esophageal reflux disease without esophagitis: Secondary | ICD-10-CM | POA: Diagnosis not present

## 2024-07-16 DIAGNOSIS — N1832 Chronic kidney disease, stage 3b: Secondary | ICD-10-CM | POA: Diagnosis not present

## 2024-07-16 DIAGNOSIS — I161 Hypertensive emergency: Secondary | ICD-10-CM | POA: Diagnosis not present

## 2024-07-16 DIAGNOSIS — M109 Gout, unspecified: Secondary | ICD-10-CM | POA: Diagnosis not present

## 2024-07-16 DIAGNOSIS — I083 Combined rheumatic disorders of mitral, aortic and tricuspid valves: Secondary | ICD-10-CM | POA: Diagnosis not present

## 2024-07-16 DIAGNOSIS — H409 Unspecified glaucoma: Secondary | ICD-10-CM | POA: Diagnosis not present

## 2024-07-16 DIAGNOSIS — I130 Hypertensive heart and chronic kidney disease with heart failure and stage 1 through stage 4 chronic kidney disease, or unspecified chronic kidney disease: Secondary | ICD-10-CM | POA: Diagnosis not present

## 2024-07-16 DIAGNOSIS — D519 Vitamin B12 deficiency anemia, unspecified: Secondary | ICD-10-CM | POA: Diagnosis not present

## 2024-07-16 DIAGNOSIS — M19042 Primary osteoarthritis, left hand: Secondary | ICD-10-CM | POA: Diagnosis not present

## 2024-07-16 DIAGNOSIS — K5730 Diverticulosis of large intestine without perforation or abscess without bleeding: Secondary | ICD-10-CM | POA: Diagnosis not present

## 2024-07-16 DIAGNOSIS — E854 Organ-limited amyloidosis: Secondary | ICD-10-CM | POA: Diagnosis not present

## 2024-07-16 DIAGNOSIS — E46 Unspecified protein-calorie malnutrition: Secondary | ICD-10-CM | POA: Diagnosis not present

## 2024-07-16 DIAGNOSIS — D500 Iron deficiency anemia secondary to blood loss (chronic): Secondary | ICD-10-CM | POA: Diagnosis not present

## 2024-07-16 DIAGNOSIS — E782 Mixed hyperlipidemia: Secondary | ICD-10-CM | POA: Diagnosis not present

## 2024-07-16 DIAGNOSIS — J4489 Other specified chronic obstructive pulmonary disease: Secondary | ICD-10-CM | POA: Diagnosis not present

## 2024-07-16 DIAGNOSIS — I5033 Acute on chronic diastolic (congestive) heart failure: Secondary | ICD-10-CM | POA: Diagnosis not present

## 2024-07-16 DIAGNOSIS — D631 Anemia in chronic kidney disease: Secondary | ICD-10-CM | POA: Diagnosis not present

## 2024-07-16 DIAGNOSIS — M19041 Primary osteoarthritis, right hand: Secondary | ICD-10-CM | POA: Diagnosis not present

## 2024-07-16 DIAGNOSIS — I43 Cardiomyopathy in diseases classified elsewhere: Secondary | ICD-10-CM | POA: Diagnosis not present

## 2024-07-16 DIAGNOSIS — E039 Hypothyroidism, unspecified: Secondary | ICD-10-CM | POA: Diagnosis not present

## 2024-07-16 DIAGNOSIS — I714 Abdominal aortic aneurysm, without rupture, unspecified: Secondary | ICD-10-CM | POA: Diagnosis not present

## 2024-07-18 DIAGNOSIS — K5730 Diverticulosis of large intestine without perforation or abscess without bleeding: Secondary | ICD-10-CM | POA: Diagnosis not present

## 2024-07-18 DIAGNOSIS — I130 Hypertensive heart and chronic kidney disease with heart failure and stage 1 through stage 4 chronic kidney disease, or unspecified chronic kidney disease: Secondary | ICD-10-CM | POA: Diagnosis not present

## 2024-07-18 DIAGNOSIS — E782 Mixed hyperlipidemia: Secondary | ICD-10-CM | POA: Diagnosis not present

## 2024-07-18 DIAGNOSIS — I161 Hypertensive emergency: Secondary | ICD-10-CM | POA: Diagnosis not present

## 2024-07-18 DIAGNOSIS — D519 Vitamin B12 deficiency anemia, unspecified: Secondary | ICD-10-CM | POA: Diagnosis not present

## 2024-07-18 DIAGNOSIS — I714 Abdominal aortic aneurysm, without rupture, unspecified: Secondary | ICD-10-CM | POA: Diagnosis not present

## 2024-07-18 DIAGNOSIS — I083 Combined rheumatic disorders of mitral, aortic and tricuspid valves: Secondary | ICD-10-CM | POA: Diagnosis not present

## 2024-07-18 DIAGNOSIS — I4510 Unspecified right bundle-branch block: Secondary | ICD-10-CM | POA: Diagnosis not present

## 2024-07-18 DIAGNOSIS — M19042 Primary osteoarthritis, left hand: Secondary | ICD-10-CM | POA: Diagnosis not present

## 2024-07-18 DIAGNOSIS — E46 Unspecified protein-calorie malnutrition: Secondary | ICD-10-CM | POA: Diagnosis not present

## 2024-07-18 DIAGNOSIS — Z6820 Body mass index (BMI) 20.0-20.9, adult: Secondary | ICD-10-CM | POA: Diagnosis not present

## 2024-07-18 DIAGNOSIS — I5033 Acute on chronic diastolic (congestive) heart failure: Secondary | ICD-10-CM | POA: Diagnosis not present

## 2024-07-18 DIAGNOSIS — I43 Cardiomyopathy in diseases classified elsewhere: Secondary | ICD-10-CM | POA: Diagnosis not present

## 2024-07-18 DIAGNOSIS — D631 Anemia in chronic kidney disease: Secondary | ICD-10-CM | POA: Diagnosis not present

## 2024-07-18 DIAGNOSIS — D500 Iron deficiency anemia secondary to blood loss (chronic): Secondary | ICD-10-CM | POA: Diagnosis not present

## 2024-07-18 DIAGNOSIS — E039 Hypothyroidism, unspecified: Secondary | ICD-10-CM | POA: Diagnosis not present

## 2024-07-18 DIAGNOSIS — J4489 Other specified chronic obstructive pulmonary disease: Secondary | ICD-10-CM | POA: Diagnosis not present

## 2024-07-18 DIAGNOSIS — E854 Organ-limited amyloidosis: Secondary | ICD-10-CM | POA: Diagnosis not present

## 2024-07-18 DIAGNOSIS — N1832 Chronic kidney disease, stage 3b: Secondary | ICD-10-CM | POA: Diagnosis not present

## 2024-07-18 DIAGNOSIS — M19041 Primary osteoarthritis, right hand: Secondary | ICD-10-CM | POA: Diagnosis not present

## 2024-07-18 DIAGNOSIS — I5030 Unspecified diastolic (congestive) heart failure: Secondary | ICD-10-CM | POA: Diagnosis not present

## 2024-07-18 DIAGNOSIS — N179 Acute kidney failure, unspecified: Secondary | ICD-10-CM | POA: Diagnosis not present

## 2024-07-18 DIAGNOSIS — H409 Unspecified glaucoma: Secondary | ICD-10-CM | POA: Diagnosis not present

## 2024-07-18 DIAGNOSIS — M109 Gout, unspecified: Secondary | ICD-10-CM | POA: Diagnosis not present

## 2024-07-18 DIAGNOSIS — K219 Gastro-esophageal reflux disease without esophagitis: Secondary | ICD-10-CM | POA: Diagnosis not present

## 2024-07-19 DIAGNOSIS — I161 Hypertensive emergency: Secondary | ICD-10-CM | POA: Diagnosis not present

## 2024-07-19 DIAGNOSIS — I130 Hypertensive heart and chronic kidney disease with heart failure and stage 1 through stage 4 chronic kidney disease, or unspecified chronic kidney disease: Secondary | ICD-10-CM | POA: Diagnosis not present

## 2024-07-19 DIAGNOSIS — I083 Combined rheumatic disorders of mitral, aortic and tricuspid valves: Secondary | ICD-10-CM | POA: Diagnosis not present

## 2024-07-19 DIAGNOSIS — I714 Abdominal aortic aneurysm, without rupture, unspecified: Secondary | ICD-10-CM | POA: Diagnosis not present

## 2024-07-19 DIAGNOSIS — M19041 Primary osteoarthritis, right hand: Secondary | ICD-10-CM | POA: Diagnosis not present

## 2024-07-19 DIAGNOSIS — E854 Organ-limited amyloidosis: Secondary | ICD-10-CM | POA: Diagnosis not present

## 2024-07-19 DIAGNOSIS — M109 Gout, unspecified: Secondary | ICD-10-CM | POA: Diagnosis not present

## 2024-07-19 DIAGNOSIS — K5730 Diverticulosis of large intestine without perforation or abscess without bleeding: Secondary | ICD-10-CM | POA: Diagnosis not present

## 2024-07-19 DIAGNOSIS — E46 Unspecified protein-calorie malnutrition: Secondary | ICD-10-CM | POA: Diagnosis not present

## 2024-07-19 DIAGNOSIS — K219 Gastro-esophageal reflux disease without esophagitis: Secondary | ICD-10-CM | POA: Diagnosis not present

## 2024-07-19 DIAGNOSIS — I4510 Unspecified right bundle-branch block: Secondary | ICD-10-CM | POA: Diagnosis not present

## 2024-07-19 DIAGNOSIS — I43 Cardiomyopathy in diseases classified elsewhere: Secondary | ICD-10-CM | POA: Diagnosis not present

## 2024-07-19 DIAGNOSIS — I5033 Acute on chronic diastolic (congestive) heart failure: Secondary | ICD-10-CM | POA: Diagnosis not present

## 2024-07-19 DIAGNOSIS — D500 Iron deficiency anemia secondary to blood loss (chronic): Secondary | ICD-10-CM | POA: Diagnosis not present

## 2024-07-19 DIAGNOSIS — E782 Mixed hyperlipidemia: Secondary | ICD-10-CM | POA: Diagnosis not present

## 2024-07-19 DIAGNOSIS — M19042 Primary osteoarthritis, left hand: Secondary | ICD-10-CM | POA: Diagnosis not present

## 2024-07-19 DIAGNOSIS — H409 Unspecified glaucoma: Secondary | ICD-10-CM | POA: Diagnosis not present

## 2024-07-19 DIAGNOSIS — E039 Hypothyroidism, unspecified: Secondary | ICD-10-CM | POA: Diagnosis not present

## 2024-07-19 DIAGNOSIS — N1832 Chronic kidney disease, stage 3b: Secondary | ICD-10-CM | POA: Diagnosis not present

## 2024-07-19 DIAGNOSIS — D519 Vitamin B12 deficiency anemia, unspecified: Secondary | ICD-10-CM | POA: Diagnosis not present

## 2024-07-19 DIAGNOSIS — J4489 Other specified chronic obstructive pulmonary disease: Secondary | ICD-10-CM | POA: Diagnosis not present

## 2024-07-19 DIAGNOSIS — D631 Anemia in chronic kidney disease: Secondary | ICD-10-CM | POA: Diagnosis not present

## 2024-07-22 DIAGNOSIS — I43 Cardiomyopathy in diseases classified elsewhere: Secondary | ICD-10-CM | POA: Diagnosis not present

## 2024-07-22 DIAGNOSIS — I5033 Acute on chronic diastolic (congestive) heart failure: Secondary | ICD-10-CM | POA: Diagnosis not present

## 2024-07-22 DIAGNOSIS — D500 Iron deficiency anemia secondary to blood loss (chronic): Secondary | ICD-10-CM | POA: Diagnosis not present

## 2024-07-22 DIAGNOSIS — J4489 Other specified chronic obstructive pulmonary disease: Secondary | ICD-10-CM | POA: Diagnosis not present

## 2024-07-22 DIAGNOSIS — E782 Mixed hyperlipidemia: Secondary | ICD-10-CM | POA: Diagnosis not present

## 2024-07-22 DIAGNOSIS — K5730 Diverticulosis of large intestine without perforation or abscess without bleeding: Secondary | ICD-10-CM | POA: Diagnosis not present

## 2024-07-22 DIAGNOSIS — I161 Hypertensive emergency: Secondary | ICD-10-CM | POA: Diagnosis not present

## 2024-07-22 DIAGNOSIS — E854 Organ-limited amyloidosis: Secondary | ICD-10-CM | POA: Diagnosis not present

## 2024-07-22 DIAGNOSIS — D519 Vitamin B12 deficiency anemia, unspecified: Secondary | ICD-10-CM | POA: Diagnosis not present

## 2024-07-22 DIAGNOSIS — I083 Combined rheumatic disorders of mitral, aortic and tricuspid valves: Secondary | ICD-10-CM | POA: Diagnosis not present

## 2024-07-22 DIAGNOSIS — E46 Unspecified protein-calorie malnutrition: Secondary | ICD-10-CM | POA: Diagnosis not present

## 2024-07-22 DIAGNOSIS — E039 Hypothyroidism, unspecified: Secondary | ICD-10-CM | POA: Diagnosis not present

## 2024-07-22 DIAGNOSIS — M19041 Primary osteoarthritis, right hand: Secondary | ICD-10-CM | POA: Diagnosis not present

## 2024-07-22 DIAGNOSIS — I130 Hypertensive heart and chronic kidney disease with heart failure and stage 1 through stage 4 chronic kidney disease, or unspecified chronic kidney disease: Secondary | ICD-10-CM | POA: Diagnosis not present

## 2024-07-22 DIAGNOSIS — H409 Unspecified glaucoma: Secondary | ICD-10-CM | POA: Diagnosis not present

## 2024-07-22 DIAGNOSIS — I714 Abdominal aortic aneurysm, without rupture, unspecified: Secondary | ICD-10-CM | POA: Diagnosis not present

## 2024-07-22 DIAGNOSIS — I4510 Unspecified right bundle-branch block: Secondary | ICD-10-CM | POA: Diagnosis not present

## 2024-07-22 DIAGNOSIS — M19042 Primary osteoarthritis, left hand: Secondary | ICD-10-CM | POA: Diagnosis not present

## 2024-07-22 DIAGNOSIS — D631 Anemia in chronic kidney disease: Secondary | ICD-10-CM | POA: Diagnosis not present

## 2024-07-22 DIAGNOSIS — M109 Gout, unspecified: Secondary | ICD-10-CM | POA: Diagnosis not present

## 2024-07-22 DIAGNOSIS — K219 Gastro-esophageal reflux disease without esophagitis: Secondary | ICD-10-CM | POA: Diagnosis not present

## 2024-07-22 DIAGNOSIS — N1832 Chronic kidney disease, stage 3b: Secondary | ICD-10-CM | POA: Diagnosis not present

## 2024-07-23 DIAGNOSIS — N1832 Chronic kidney disease, stage 3b: Secondary | ICD-10-CM | POA: Diagnosis not present

## 2024-07-23 DIAGNOSIS — I161 Hypertensive emergency: Secondary | ICD-10-CM | POA: Diagnosis not present

## 2024-07-23 DIAGNOSIS — I083 Combined rheumatic disorders of mitral, aortic and tricuspid valves: Secondary | ICD-10-CM | POA: Diagnosis not present

## 2024-07-23 DIAGNOSIS — K5730 Diverticulosis of large intestine without perforation or abscess without bleeding: Secondary | ICD-10-CM | POA: Diagnosis not present

## 2024-07-23 DIAGNOSIS — K219 Gastro-esophageal reflux disease without esophagitis: Secondary | ICD-10-CM | POA: Diagnosis not present

## 2024-07-23 DIAGNOSIS — E039 Hypothyroidism, unspecified: Secondary | ICD-10-CM | POA: Diagnosis not present

## 2024-07-23 DIAGNOSIS — M109 Gout, unspecified: Secondary | ICD-10-CM | POA: Diagnosis not present

## 2024-07-23 DIAGNOSIS — M19042 Primary osteoarthritis, left hand: Secondary | ICD-10-CM | POA: Diagnosis not present

## 2024-07-23 DIAGNOSIS — D519 Vitamin B12 deficiency anemia, unspecified: Secondary | ICD-10-CM | POA: Diagnosis not present

## 2024-07-23 DIAGNOSIS — I130 Hypertensive heart and chronic kidney disease with heart failure and stage 1 through stage 4 chronic kidney disease, or unspecified chronic kidney disease: Secondary | ICD-10-CM | POA: Diagnosis not present

## 2024-07-23 DIAGNOSIS — D631 Anemia in chronic kidney disease: Secondary | ICD-10-CM | POA: Diagnosis not present

## 2024-07-23 DIAGNOSIS — M19041 Primary osteoarthritis, right hand: Secondary | ICD-10-CM | POA: Diagnosis not present

## 2024-07-23 DIAGNOSIS — E782 Mixed hyperlipidemia: Secondary | ICD-10-CM | POA: Diagnosis not present

## 2024-07-23 DIAGNOSIS — D500 Iron deficiency anemia secondary to blood loss (chronic): Secondary | ICD-10-CM | POA: Diagnosis not present

## 2024-07-23 DIAGNOSIS — H409 Unspecified glaucoma: Secondary | ICD-10-CM | POA: Diagnosis not present

## 2024-07-23 DIAGNOSIS — I4510 Unspecified right bundle-branch block: Secondary | ICD-10-CM | POA: Diagnosis not present

## 2024-07-23 DIAGNOSIS — I714 Abdominal aortic aneurysm, without rupture, unspecified: Secondary | ICD-10-CM | POA: Diagnosis not present

## 2024-07-23 DIAGNOSIS — I43 Cardiomyopathy in diseases classified elsewhere: Secondary | ICD-10-CM | POA: Diagnosis not present

## 2024-07-23 DIAGNOSIS — E854 Organ-limited amyloidosis: Secondary | ICD-10-CM | POA: Diagnosis not present

## 2024-07-23 DIAGNOSIS — I5033 Acute on chronic diastolic (congestive) heart failure: Secondary | ICD-10-CM | POA: Diagnosis not present

## 2024-07-23 DIAGNOSIS — J4489 Other specified chronic obstructive pulmonary disease: Secondary | ICD-10-CM | POA: Diagnosis not present

## 2024-07-23 DIAGNOSIS — E46 Unspecified protein-calorie malnutrition: Secondary | ICD-10-CM | POA: Diagnosis not present

## 2024-07-24 DIAGNOSIS — J45909 Unspecified asthma, uncomplicated: Secondary | ICD-10-CM | POA: Insufficient documentation

## 2024-07-25 ENCOUNTER — Encounter: Payer: Self-pay | Admitting: Internal Medicine

## 2024-07-25 ENCOUNTER — Ambulatory Visit

## 2024-07-25 DIAGNOSIS — D519 Vitamin B12 deficiency anemia, unspecified: Secondary | ICD-10-CM | POA: Diagnosis not present

## 2024-07-25 DIAGNOSIS — D500 Iron deficiency anemia secondary to blood loss (chronic): Secondary | ICD-10-CM | POA: Diagnosis not present

## 2024-07-25 DIAGNOSIS — E46 Unspecified protein-calorie malnutrition: Secondary | ICD-10-CM | POA: Diagnosis not present

## 2024-07-25 DIAGNOSIS — I714 Abdominal aortic aneurysm, without rupture, unspecified: Secondary | ICD-10-CM | POA: Diagnosis not present

## 2024-07-25 DIAGNOSIS — E854 Organ-limited amyloidosis: Secondary | ICD-10-CM | POA: Diagnosis not present

## 2024-07-25 DIAGNOSIS — M109 Gout, unspecified: Secondary | ICD-10-CM | POA: Diagnosis not present

## 2024-07-25 DIAGNOSIS — I161 Hypertensive emergency: Secondary | ICD-10-CM | POA: Diagnosis not present

## 2024-07-25 DIAGNOSIS — I4510 Unspecified right bundle-branch block: Secondary | ICD-10-CM | POA: Diagnosis not present

## 2024-07-25 DIAGNOSIS — M19042 Primary osteoarthritis, left hand: Secondary | ICD-10-CM | POA: Diagnosis not present

## 2024-07-25 DIAGNOSIS — E039 Hypothyroidism, unspecified: Secondary | ICD-10-CM | POA: Diagnosis not present

## 2024-07-25 DIAGNOSIS — I5033 Acute on chronic diastolic (congestive) heart failure: Secondary | ICD-10-CM | POA: Diagnosis not present

## 2024-07-25 DIAGNOSIS — N1832 Chronic kidney disease, stage 3b: Secondary | ICD-10-CM | POA: Diagnosis not present

## 2024-07-25 DIAGNOSIS — I43 Cardiomyopathy in diseases classified elsewhere: Secondary | ICD-10-CM | POA: Diagnosis not present

## 2024-07-25 DIAGNOSIS — K219 Gastro-esophageal reflux disease without esophagitis: Secondary | ICD-10-CM | POA: Diagnosis not present

## 2024-07-25 DIAGNOSIS — D631 Anemia in chronic kidney disease: Secondary | ICD-10-CM | POA: Diagnosis not present

## 2024-07-25 DIAGNOSIS — J4489 Other specified chronic obstructive pulmonary disease: Secondary | ICD-10-CM | POA: Diagnosis not present

## 2024-07-25 DIAGNOSIS — I083 Combined rheumatic disorders of mitral, aortic and tricuspid valves: Secondary | ICD-10-CM | POA: Diagnosis not present

## 2024-07-25 DIAGNOSIS — K5730 Diverticulosis of large intestine without perforation or abscess without bleeding: Secondary | ICD-10-CM | POA: Diagnosis not present

## 2024-07-25 DIAGNOSIS — M19041 Primary osteoarthritis, right hand: Secondary | ICD-10-CM | POA: Diagnosis not present

## 2024-07-25 DIAGNOSIS — E782 Mixed hyperlipidemia: Secondary | ICD-10-CM | POA: Diagnosis not present

## 2024-07-25 DIAGNOSIS — I130 Hypertensive heart and chronic kidney disease with heart failure and stage 1 through stage 4 chronic kidney disease, or unspecified chronic kidney disease: Secondary | ICD-10-CM | POA: Diagnosis not present

## 2024-07-25 DIAGNOSIS — H409 Unspecified glaucoma: Secondary | ICD-10-CM | POA: Diagnosis not present

## 2024-07-26 ENCOUNTER — Ambulatory Visit: Payer: Self-pay

## 2024-07-26 DIAGNOSIS — E039 Hypothyroidism, unspecified: Secondary | ICD-10-CM | POA: Diagnosis not present

## 2024-07-26 DIAGNOSIS — J4489 Other specified chronic obstructive pulmonary disease: Secondary | ICD-10-CM | POA: Diagnosis not present

## 2024-07-26 DIAGNOSIS — I43 Cardiomyopathy in diseases classified elsewhere: Secondary | ICD-10-CM | POA: Diagnosis not present

## 2024-07-26 DIAGNOSIS — N1832 Chronic kidney disease, stage 3b: Secondary | ICD-10-CM | POA: Diagnosis not present

## 2024-07-26 DIAGNOSIS — I083 Combined rheumatic disorders of mitral, aortic and tricuspid valves: Secondary | ICD-10-CM | POA: Diagnosis not present

## 2024-07-26 DIAGNOSIS — I5033 Acute on chronic diastolic (congestive) heart failure: Secondary | ICD-10-CM | POA: Diagnosis not present

## 2024-07-26 DIAGNOSIS — E854 Organ-limited amyloidosis: Secondary | ICD-10-CM | POA: Diagnosis not present

## 2024-07-26 DIAGNOSIS — I161 Hypertensive emergency: Secondary | ICD-10-CM | POA: Diagnosis not present

## 2024-07-26 DIAGNOSIS — I714 Abdominal aortic aneurysm, without rupture, unspecified: Secondary | ICD-10-CM | POA: Diagnosis not present

## 2024-07-26 DIAGNOSIS — M19041 Primary osteoarthritis, right hand: Secondary | ICD-10-CM | POA: Diagnosis not present

## 2024-07-26 DIAGNOSIS — M19042 Primary osteoarthritis, left hand: Secondary | ICD-10-CM | POA: Diagnosis not present

## 2024-07-26 DIAGNOSIS — E782 Mixed hyperlipidemia: Secondary | ICD-10-CM | POA: Diagnosis not present

## 2024-07-26 DIAGNOSIS — H409 Unspecified glaucoma: Secondary | ICD-10-CM | POA: Diagnosis not present

## 2024-07-26 DIAGNOSIS — D500 Iron deficiency anemia secondary to blood loss (chronic): Secondary | ICD-10-CM | POA: Diagnosis not present

## 2024-07-26 DIAGNOSIS — K5730 Diverticulosis of large intestine without perforation or abscess without bleeding: Secondary | ICD-10-CM | POA: Diagnosis not present

## 2024-07-26 DIAGNOSIS — K219 Gastro-esophageal reflux disease without esophagitis: Secondary | ICD-10-CM | POA: Diagnosis not present

## 2024-07-26 DIAGNOSIS — D519 Vitamin B12 deficiency anemia, unspecified: Secondary | ICD-10-CM | POA: Diagnosis not present

## 2024-07-26 DIAGNOSIS — E46 Unspecified protein-calorie malnutrition: Secondary | ICD-10-CM | POA: Diagnosis not present

## 2024-07-26 DIAGNOSIS — M109 Gout, unspecified: Secondary | ICD-10-CM | POA: Diagnosis not present

## 2024-07-26 DIAGNOSIS — I4510 Unspecified right bundle-branch block: Secondary | ICD-10-CM | POA: Diagnosis not present

## 2024-07-26 DIAGNOSIS — I130 Hypertensive heart and chronic kidney disease with heart failure and stage 1 through stage 4 chronic kidney disease, or unspecified chronic kidney disease: Secondary | ICD-10-CM | POA: Diagnosis not present

## 2024-07-26 DIAGNOSIS — D631 Anemia in chronic kidney disease: Secondary | ICD-10-CM | POA: Diagnosis not present

## 2024-07-29 DIAGNOSIS — H409 Unspecified glaucoma: Secondary | ICD-10-CM | POA: Diagnosis not present

## 2024-07-29 DIAGNOSIS — E46 Unspecified protein-calorie malnutrition: Secondary | ICD-10-CM | POA: Diagnosis not present

## 2024-07-29 DIAGNOSIS — M19042 Primary osteoarthritis, left hand: Secondary | ICD-10-CM | POA: Diagnosis not present

## 2024-07-29 DIAGNOSIS — M109 Gout, unspecified: Secondary | ICD-10-CM | POA: Diagnosis not present

## 2024-07-29 DIAGNOSIS — D500 Iron deficiency anemia secondary to blood loss (chronic): Secondary | ICD-10-CM | POA: Diagnosis not present

## 2024-07-29 DIAGNOSIS — I5033 Acute on chronic diastolic (congestive) heart failure: Secondary | ICD-10-CM | POA: Diagnosis not present

## 2024-07-29 DIAGNOSIS — I161 Hypertensive emergency: Secondary | ICD-10-CM | POA: Diagnosis not present

## 2024-07-29 DIAGNOSIS — D519 Vitamin B12 deficiency anemia, unspecified: Secondary | ICD-10-CM | POA: Diagnosis not present

## 2024-07-29 DIAGNOSIS — K5730 Diverticulosis of large intestine without perforation or abscess without bleeding: Secondary | ICD-10-CM | POA: Diagnosis not present

## 2024-07-29 DIAGNOSIS — I130 Hypertensive heart and chronic kidney disease with heart failure and stage 1 through stage 4 chronic kidney disease, or unspecified chronic kidney disease: Secondary | ICD-10-CM | POA: Diagnosis not present

## 2024-07-29 DIAGNOSIS — K219 Gastro-esophageal reflux disease without esophagitis: Secondary | ICD-10-CM | POA: Diagnosis not present

## 2024-07-29 DIAGNOSIS — M19041 Primary osteoarthritis, right hand: Secondary | ICD-10-CM | POA: Diagnosis not present

## 2024-07-29 DIAGNOSIS — E782 Mixed hyperlipidemia: Secondary | ICD-10-CM | POA: Diagnosis not present

## 2024-07-29 DIAGNOSIS — E854 Organ-limited amyloidosis: Secondary | ICD-10-CM | POA: Diagnosis not present

## 2024-07-29 DIAGNOSIS — N1832 Chronic kidney disease, stage 3b: Secondary | ICD-10-CM | POA: Diagnosis not present

## 2024-07-29 DIAGNOSIS — J4489 Other specified chronic obstructive pulmonary disease: Secondary | ICD-10-CM | POA: Diagnosis not present

## 2024-07-29 DIAGNOSIS — I4510 Unspecified right bundle-branch block: Secondary | ICD-10-CM | POA: Diagnosis not present

## 2024-07-29 DIAGNOSIS — E039 Hypothyroidism, unspecified: Secondary | ICD-10-CM | POA: Diagnosis not present

## 2024-07-29 DIAGNOSIS — D631 Anemia in chronic kidney disease: Secondary | ICD-10-CM | POA: Diagnosis not present

## 2024-07-29 DIAGNOSIS — I43 Cardiomyopathy in diseases classified elsewhere: Secondary | ICD-10-CM | POA: Diagnosis not present

## 2024-07-29 DIAGNOSIS — I083 Combined rheumatic disorders of mitral, aortic and tricuspid valves: Secondary | ICD-10-CM | POA: Diagnosis not present

## 2024-07-29 DIAGNOSIS — I714 Abdominal aortic aneurysm, without rupture, unspecified: Secondary | ICD-10-CM | POA: Diagnosis not present

## 2024-07-30 ENCOUNTER — Ambulatory Visit

## 2024-07-30 VITALS — BP 178/74 | HR 61 | Ht 65.0 in | Wt 117.0 lb

## 2024-07-30 DIAGNOSIS — K219 Gastro-esophageal reflux disease without esophagitis: Secondary | ICD-10-CM | POA: Diagnosis not present

## 2024-07-30 DIAGNOSIS — I4510 Unspecified right bundle-branch block: Secondary | ICD-10-CM | POA: Diagnosis not present

## 2024-07-30 DIAGNOSIS — M19041 Primary osteoarthritis, right hand: Secondary | ICD-10-CM | POA: Diagnosis not present

## 2024-07-30 DIAGNOSIS — H409 Unspecified glaucoma: Secondary | ICD-10-CM | POA: Diagnosis not present

## 2024-07-30 DIAGNOSIS — I714 Abdominal aortic aneurysm, without rupture, unspecified: Secondary | ICD-10-CM | POA: Diagnosis not present

## 2024-07-30 DIAGNOSIS — M19042 Primary osteoarthritis, left hand: Secondary | ICD-10-CM | POA: Diagnosis not present

## 2024-07-30 DIAGNOSIS — D519 Vitamin B12 deficiency anemia, unspecified: Secondary | ICD-10-CM | POA: Diagnosis not present

## 2024-07-30 DIAGNOSIS — N1832 Chronic kidney disease, stage 3b: Secondary | ICD-10-CM | POA: Diagnosis not present

## 2024-07-30 DIAGNOSIS — E854 Organ-limited amyloidosis: Secondary | ICD-10-CM | POA: Diagnosis not present

## 2024-07-30 DIAGNOSIS — I161 Hypertensive emergency: Secondary | ICD-10-CM | POA: Diagnosis not present

## 2024-07-30 DIAGNOSIS — I5030 Unspecified diastolic (congestive) heart failure: Secondary | ICD-10-CM | POA: Diagnosis not present

## 2024-07-30 DIAGNOSIS — J4489 Other specified chronic obstructive pulmonary disease: Secondary | ICD-10-CM | POA: Diagnosis not present

## 2024-07-30 DIAGNOSIS — I10 Essential (primary) hypertension: Secondary | ICD-10-CM | POA: Diagnosis not present

## 2024-07-30 DIAGNOSIS — D500 Iron deficiency anemia secondary to blood loss (chronic): Secondary | ICD-10-CM | POA: Diagnosis not present

## 2024-07-30 DIAGNOSIS — I130 Hypertensive heart and chronic kidney disease with heart failure and stage 1 through stage 4 chronic kidney disease, or unspecified chronic kidney disease: Secondary | ICD-10-CM | POA: Diagnosis not present

## 2024-07-30 DIAGNOSIS — E782 Mixed hyperlipidemia: Secondary | ICD-10-CM | POA: Diagnosis not present

## 2024-07-30 DIAGNOSIS — I5033 Acute on chronic diastolic (congestive) heart failure: Secondary | ICD-10-CM | POA: Diagnosis not present

## 2024-07-30 DIAGNOSIS — M109 Gout, unspecified: Secondary | ICD-10-CM | POA: Diagnosis not present

## 2024-07-30 DIAGNOSIS — E46 Unspecified protein-calorie malnutrition: Secondary | ICD-10-CM | POA: Diagnosis not present

## 2024-07-30 DIAGNOSIS — K5730 Diverticulosis of large intestine without perforation or abscess without bleeding: Secondary | ICD-10-CM | POA: Diagnosis not present

## 2024-07-30 DIAGNOSIS — I083 Combined rheumatic disorders of mitral, aortic and tricuspid valves: Secondary | ICD-10-CM | POA: Diagnosis not present

## 2024-07-30 DIAGNOSIS — I43 Cardiomyopathy in diseases classified elsewhere: Secondary | ICD-10-CM | POA: Diagnosis not present

## 2024-07-30 DIAGNOSIS — E039 Hypothyroidism, unspecified: Secondary | ICD-10-CM | POA: Diagnosis not present

## 2024-07-30 DIAGNOSIS — D631 Anemia in chronic kidney disease: Secondary | ICD-10-CM | POA: Diagnosis not present

## 2024-07-30 NOTE — Patient Instructions (Addendum)
 Medication Instructions:  Your physician recommends that you continue on your current medications as directed. Please refer to the Current Medication list given to you today.  *If you need a refill on your cardiac medications before your next appointment, please call your pharmacy*   Lab Work: BMP, MGM- today If you have labs (blood work) drawn today and your tests are completely normal, you will receive your results only by: MyChart Message (if you have MyChart) OR A paper copy in the mail If you have any lab test that is abnormal or we need to change your treatment, we will call you to review the results.   Testing/Procedures: None Ordered   Follow-Up: At [facility], you and your health needs are our priority.  As part of our continuing mission to provide you with exceptional heart care, we have created designated Provider Care Teams.  These Care Teams include your primary Cardiologist (physician) and Advanced Practice Providers (APPs -  Physician Assistants and Nurse Practitioners) who all work together to provide you with the care you need, when you need it.  We recommend signing up for the patient portal called MyChart.  Sign up information is provided on this After Visit Summary.  MyChart is used to connect with patients for Virtual Visits (Telemedicine).  Patients are able to view lab/test results, encounter notes, upcoming appointments, etc.  Non-urgent messages can be sent to your provider as well.   To learn more about what you can do with MyChart, go to [email or web address].    Your next appointment:   1 month(s)  The format for your next appointment:   In Person  Provider:   Alean Kobus, MD   Other Instructions NA

## 2024-07-30 NOTE — Assessment & Plan Note (Signed)
 Uncontrolled. Mentions at home earlier today that these were well-controlled. Known to have labile blood pressures while inpatient.  Continue amlodipine 5 mg once daily as per discharge summary from [facility]. Continue carvedilol 25 mg twice daily.  Target blood pressure below 140/80 mmHg given her advanced age.

## 2024-07-30 NOTE — Progress Notes (Signed)
 Cardiology Consultation:    Date:  07/30/2024   ID:  Rachel Ho, DOB [birth year], MRN [id number]  PCP:  Rachel Marcellus RAMAN, MD  Cardiologist:  Rachel SAUNDERS Victorian Gunn, MD   Referring MD: Rachel Marcellus RAMAN, MD   No chief complaint on file.    ASSESSMENT AND PLAN:   Rachel Ho 89 year old woman with history of chronic diastolic CHF with preserved LVEF [EF 55 to 60%, moderate concentric LVH on TTE 07/04/2024 at [facility], hypertension, asthma, AAA s/p endovascular repair 07/24/2020, anemia, COPD, diverticulosis, chronic small pericardial effusion and remote history of pericarditis, CKD stage III. Previously followed up with cardiologist at [facility]. After recent admission at [facility] 07/03/2024, for acute heart failure with preserved ejection fraction, hypertensive urgency, discharged home and here for follow-up visit.  Problem List Items Addressed This Visit     Hypertension   Uncontrolled. Mentions at home earlier today that these were well-controlled. Known to have labile blood pressures while inpatient.  Continue amlodipine 5 mg once daily as per discharge summary from [facility]. Continue carvedilol 25 mg twice daily.  Target blood pressure below 140/80 mmHg given her advanced age.       Diastolic congestive heart failure (HCC) - Primary   Chronic diastolic CHF with preserved LVEF 55 to 60% on echocardiogram 07/04/2024 in the setting of hypertensive heart disease with moderate LVH.  Appears compensated euvolemic. Educated about salt and fluid restriction to below 2 g/day sodium and below 2 L/day fluid intake.  Continue with furosemide 20 mg once daily.  Optimize hypertension management as discussed below. Given renal function and not a candidate to escalate ACE inhibitor/ARB/Entresto or aldosterone antagonist at this time.  Will obtain blood work BMP, magnesium  today. Will review medications once family confirms it. Will  have him return to office for follow-up in 1 month to further optimize her guideline directed medical therapy based on renal function, blood pressures.      Relevant Orders   EKG 12-Lead (Completed)   Basic metabolic panel with GFR   Magnesium    Return to clinic tentatively for follow-up in 1 month.   History of Present Illness:    Rachel Ho is a 89 y.o. female who is being seen today for the evaluation of chronic diastolic CHF at the request of Rachel Marcellus RAMAN, MD. Previously followed up with cardiologist at [facility] last visit 04/22/2022 with Rachel Sella NP . Was evaluated by [facility] cardiologist at [facility] 07/06/2024 during inpatient stay and admission for hypertensive emergency.  Here for the visit today accompanied by her son Rachel Ho.  Her daughter Rachel Ho was present over the phone for discussion.  She lives at home and her family members take turns taking care of her.  Has history of hypertension, asthma, AAA s/p endovascular repair 07/24/2020, anemia, COPD, diverticulosis, chronic small pericardial effusion and remote history of pericarditis, CKD stage III  She was admitted for symptoms of shortness of breath and noted to have elevated blood pressures on 07/03/2024 at [facility], treated for hypertensive emergency and acute chronic diastolic heart failure, with IV diuretics and blood pressure medications adjusted, renal function noted to have increased from baseline 1.2 up to 2 before trending down to 1.9. Troponin I was relatively flat at 0.07, 0.09 and 0.1, echocardiogram showed mild to moderate concentric LVH with EF 55 to 60%, grade 1 diastolic dysfunction, RV dilated with mildly reduced function, severely dilated left atrium, mild aortic insufficiency, trace TR, small to  moderate amount of pericardial effusion without tamponade physiology appears similar in comparison to prior echocardiogram from normal 2021.  Given LVH in the setting of  longstanding history of uncontrolled hypertension features are consistent with hypertensive heart disease.  Mentions overall she feels her breathing is improved compared to the hospital admission. Tends to get out of breath at times laying flat. Denies any chest pain. Daughter mentions she does have nagging cough once in a while. Denies any palpitations,. Has been undergoing physical therapy at home. Mentions blood pressures tend to fluctuate and earlier today at home these were normal.  Did not bring blood pressure medications today to the visit hence unable to reconcile medications accurately. Discharge summary from [facility] 07/08/2024 she was discharged on carvedilol 25 twice daily, amlodipine 5 mg once daily Furosemide 20 mg p.o. once daily. Apart from that on small dose of potassium chloride  10 mEq supplementation.  EKG obtained today shows sinus rhythm heart rate 61/min, PR interval 175 ms.  QRS duration 136 ms, RBBB morphology.  QTc 422 ms  Past Medical History:  Diagnosis Date   Abdominal aortic aneurysm (AAA) without rupture (HCC) 04/22/2022   AKI (acute kidney injury) (HCC) 09/15/2020   Allergic rhinitis 09/16/2015   Asthma    COPD (chronic obstructive pulmonary disease) (HCC)    Dyslipidemia 06/15/2015   Generalized weakness    GERD (gastroesophageal reflux disease)    High blood pressure    Nonallergic rhinitis 09/16/2015   Protein malnutrition (HCC) 09/15/2020    Past Surgical History:  Procedure Laterality Date   ABDOMINAL HYSTERECTOMY  1984   HERNIA REPAIR  1984    Current Medications: Current Meds  Medication Sig   ASPIRIN  LOW DOSE 81 MG EC tablet Take 81 mg by mouth daily.   Cholecalciferol (VITAMIN D PO) Take by mouth daily.   esomeprazole  (NEXIUM ) 40 MG capsule TAKE ONE CAPSULE BY MOUTH EVERY DAY (Patient taking differently: Take 40 mg by mouth daily. )   ezetimibe (ZETIA) 10 MG tablet Take 10 mg by mouth daily.   feeding supplement  (ENSURE ENLIVE / ENSURE PLUS) LIQD Take 237 mLs by mouth 2 (two) times daily between meals.   fluconazole (DIFLUCAN) 100 MG tablet Take 100 mg by mouth daily.   Fluocinolone Acetonide 0.01 % OIL Place 1 drop in ear(s) daily as needed.   fluticasone  (FLONASE ) 50 MCG/ACT nasal spray Place 2 sprays into both nostrils daily.   levothyroxine  (SYNTHROID ) 25 MCG tablet Take 1 tablet (25 mcg total) by mouth daily at 6 (six) AM.   LINZESS 145 MCG CAPS capsule as needed.    loratadine (CLARITIN) 10 MG tablet Take 10 mg by mouth daily as needed for allergies.   LUMIGAN 0.01 % SOLN    metoprolol  (LOPRESSOR ) 50 MG tablet Take 50 mg by mouth 2 (two) times daily.    mometasone  (ELOCON ) 0.1 % cream Apply to affected areas once daily until resolved (Patient taking differently: Apply 1 application topically See admin instructions. Apply to affected areas once daily until resolved)   Multiple Vitamins-Calcium (ONE-A-DAY WOMENS PO) Take by mouth daily.   nitroGLYCERIN  (NITROSTAT ) 0.4 MG SL tablet Place 0.4 mg under the tongue.   oxybutynin (DITROPAN) 5 MG tablet    potassium chloride  SA (KLOR-CON ) 10 MEQ tablet Take 1 tablet (10 mEq total) by mouth every other day.   PROAIR  HFA 108 (90 Base) MCG/ACT inhaler Inhale two puffs every four to six hours as needed for cough or wheeze. (Patient taking differently: Inhale  2 puffs into the lungs See admin instructions. Inhale two puffs every four to six hours as needed for cough or wheeze.)   rosuvastatin (CRESTOR) 10 MG tablet Take 10 mg by mouth daily.   sodium chloride  (SALINE MIST) 0.65 % nasal spray Place 1 spray into the nose as needed for congestion.   SYMBICORT  160-4.5 MCG/ACT inhaler INHALE 2 PUFFS BY MOUTH 2 TIMES A DAY (Patient taking differently: Inhale 2 puffs into the lungs in the morning and at bedtime. )   tolterodine (DETROL LA) 2 MG 24 hr capsule Take 2 mg by mouth daily.     Allergies:   Latex, Other, Sweet potato, Tomato, Sulfa antibiotics, and  Sulfasalazine   Social History   Socioeconomic History   Marital status: Single    Spouse name: Not on file   Number of children: Not on file   Years of education: Not on file   Highest education level: Not on file  Occupational History   Occupation: retired  Tobacco Use   Smoking status: Former    Current packs/day: 0.00    Average packs/day: 2.0 packs/day for 25.0 years (50.0 ttl pk-yrs)    Types: Cigarettes    Start date: 12/06/1943    Quit date: 12/05/1968    Years since quitting: 55.6   Smokeless tobacco: Never  Vaping Use   Vaping status: Never Used  Substance and Sexual Activity   Alcohol use: No    Alcohol/week: 0.0 standard drinks of alcohol   Drug use: No   Sexual activity: Not Currently  Other Topics Concern   Not on file  Social History Narrative   Not on file   Social Drivers of Health   Financial Resource Strain: Not on file  Food Insecurity: Not on file  Transportation Needs: Not on file  Physical Activity: Not on file  Stress: Not on file  Social Connections: Not on file     Family History: The patient's family history includes High blood pressure in her mother. ROS:   Please see the history of present illness.    All 14 point review of systems negative except as described per history of present illness.  EKGs/Labs/Other Studies Reviewed:    The following studies were reviewed today:   EKG:  EKG Interpretation Date/Time:  Tuesday July 30 2024 13:01:49 EDT Ventricular Rate:  61 PR Interval:  174 QRS Duration:  136 QT Interval:  420 QTC Calculation: 422 R Axis:   82  Text Interpretation: Normal sinus rhythm Right bundle branch block When compared with ECG of 19-Sep-2020 14:30, Right bundle branch block has replaced Non-specific intra-ventricular conduction block Confirmed by Liborio Hai reddy [phone]) on 07/30/2024 1:06:19 PM    Recent Labs: No results found for requested labs within last 365 days.  Recent Lipid Panel No results  found for: CHOL, TRIG, HDL, CHOLHDL, VLDL, LDLCALC, LDLDIRECT  Physical Exam:    VS:  BP (!) 178/74   Pulse 61   Ht 5' 5 (1.651 m)   Wt 117 lb (53.1 kg)   SpO2 95%   BMI 19.47 kg/m     Wt Readings from Last 3 Encounters:  07/30/24 117 lb (53.1 kg)  09/20/20 110 lb 3.7 oz (50 kg)  01/18/16 137 lb 2 oz (62.2 kg)     GENERAL:  Well nourished, well developed in no acute distress NECK: No JVD; No carotid bruits CARDIAC: RRR, S1 and S2 present, no murmurs, no rubs, no gallops CHEST:  Clear to auscultation without rales,  wheezing or rhonchi  Extremities: No pitting pedal edema. Pulses bilaterally symmetric with radial 2+ and dorsalis pedis 2+ NEUROLOGIC:  Alert and oriented x 3  Medication Adjustments/Labs and Tests Ordered: Current medicines are reviewed at length with the patient today.  Concerns regarding medicines are outlined above.  Orders Placed This Encounter  Procedures   Basic metabolic panel with GFR   Magnesium    EKG 12-Lead   No orders of the defined types were placed in this encounter.   Signed, Rachel jess Kobus, MD, MPH, [facility] 07/30/2024 1:32 PM    [facility]

## 2024-07-30 NOTE — Assessment & Plan Note (Signed)
 Chronic diastolic CHF with preserved LVEF 55 to 60% on echocardiogram 07/04/2024 in the setting of hypertensive heart disease with moderate LVH.  Appears compensated euvolemic. Educated about salt and fluid restriction to below 2 g/day sodium and below 2 L/day fluid intake.  Continue with furosemide 20 mg once daily.  Optimize hypertension management as discussed below. Given renal function and not a candidate to escalate ACE inhibitor/ARB/Entresto or aldosterone antagonist at this time.  Will obtain blood work BMP, magnesium  today. Will review medications once family confirms it. Will have him return to office for follow-up in 1 month to further optimize her guideline directed medical therapy based on renal function, blood pressures.

## 2024-07-31 ENCOUNTER — Ambulatory Visit: Payer: Self-pay

## 2024-07-31 DIAGNOSIS — I130 Hypertensive heart and chronic kidney disease with heart failure and stage 1 through stage 4 chronic kidney disease, or unspecified chronic kidney disease: Secondary | ICD-10-CM | POA: Diagnosis not present

## 2024-07-31 DIAGNOSIS — D631 Anemia in chronic kidney disease: Secondary | ICD-10-CM | POA: Diagnosis not present

## 2024-07-31 DIAGNOSIS — N1832 Chronic kidney disease, stage 3b: Secondary | ICD-10-CM | POA: Diagnosis not present

## 2024-07-31 DIAGNOSIS — I5033 Acute on chronic diastolic (congestive) heart failure: Secondary | ICD-10-CM | POA: Diagnosis not present

## 2024-07-31 LAB — BASIC METABOLIC PANEL WITH GFR
BUN/Creatinine Ratio: 19 (ref 12–28)
BUN: 25 mg/dL (ref 8–27)
CO2: 23 mmol/L (ref 20–29)
Calcium: 10.3 mg/dL (ref 8.7–10.3)
Chloride: 103 mmol/L (ref 96–106)
Creatinine, Ser: 1.29 mg/dL — ABNORMAL HIGH (ref 0.57–1.00)
Glucose: 87 mg/dL (ref 70–99)
Potassium: 4.5 mmol/L (ref 3.5–5.2)
Sodium: 143 mmol/L (ref 134–144)
eGFR: 40 mL/min/1.73 — ABNORMAL LOW (ref >59)

## 2024-07-31 LAB — MAGNESIUM: Magnesium: 2.6 mg/dL — ABNORMAL HIGH (ref 1.6–2.3)

## 2024-08-01 DIAGNOSIS — I161 Hypertensive emergency: Secondary | ICD-10-CM | POA: Diagnosis not present

## 2024-08-01 DIAGNOSIS — D500 Iron deficiency anemia secondary to blood loss (chronic): Secondary | ICD-10-CM | POA: Diagnosis not present

## 2024-08-01 DIAGNOSIS — K5730 Diverticulosis of large intestine without perforation or abscess without bleeding: Secondary | ICD-10-CM | POA: Diagnosis not present

## 2024-08-01 DIAGNOSIS — I130 Hypertensive heart and chronic kidney disease with heart failure and stage 1 through stage 4 chronic kidney disease, or unspecified chronic kidney disease: Secondary | ICD-10-CM | POA: Diagnosis not present

## 2024-08-01 DIAGNOSIS — N1832 Chronic kidney disease, stage 3b: Secondary | ICD-10-CM | POA: Diagnosis not present

## 2024-08-01 DIAGNOSIS — E039 Hypothyroidism, unspecified: Secondary | ICD-10-CM | POA: Diagnosis not present

## 2024-08-01 DIAGNOSIS — D519 Vitamin B12 deficiency anemia, unspecified: Secondary | ICD-10-CM | POA: Diagnosis not present

## 2024-08-01 DIAGNOSIS — I5033 Acute on chronic diastolic (congestive) heart failure: Secondary | ICD-10-CM | POA: Diagnosis not present

## 2024-08-01 DIAGNOSIS — J4489 Other specified chronic obstructive pulmonary disease: Secondary | ICD-10-CM | POA: Diagnosis not present

## 2024-08-01 DIAGNOSIS — M19041 Primary osteoarthritis, right hand: Secondary | ICD-10-CM | POA: Diagnosis not present

## 2024-08-01 DIAGNOSIS — E782 Mixed hyperlipidemia: Secondary | ICD-10-CM | POA: Diagnosis not present

## 2024-08-01 DIAGNOSIS — M109 Gout, unspecified: Secondary | ICD-10-CM | POA: Diagnosis not present

## 2024-08-01 DIAGNOSIS — I714 Abdominal aortic aneurysm, without rupture, unspecified: Secondary | ICD-10-CM | POA: Diagnosis not present

## 2024-08-01 DIAGNOSIS — K219 Gastro-esophageal reflux disease without esophagitis: Secondary | ICD-10-CM | POA: Diagnosis not present

## 2024-08-01 DIAGNOSIS — I4510 Unspecified right bundle-branch block: Secondary | ICD-10-CM | POA: Diagnosis not present

## 2024-08-01 DIAGNOSIS — M19042 Primary osteoarthritis, left hand: Secondary | ICD-10-CM | POA: Diagnosis not present

## 2024-08-01 DIAGNOSIS — D631 Anemia in chronic kidney disease: Secondary | ICD-10-CM | POA: Diagnosis not present

## 2024-08-01 DIAGNOSIS — E854 Organ-limited amyloidosis: Secondary | ICD-10-CM | POA: Diagnosis not present

## 2024-08-01 DIAGNOSIS — I43 Cardiomyopathy in diseases classified elsewhere: Secondary | ICD-10-CM | POA: Diagnosis not present

## 2024-08-01 DIAGNOSIS — E46 Unspecified protein-calorie malnutrition: Secondary | ICD-10-CM | POA: Diagnosis not present

## 2024-08-01 DIAGNOSIS — I083 Combined rheumatic disorders of mitral, aortic and tricuspid valves: Secondary | ICD-10-CM | POA: Diagnosis not present

## 2024-08-01 DIAGNOSIS — H409 Unspecified glaucoma: Secondary | ICD-10-CM | POA: Diagnosis not present

## 2024-08-02 ENCOUNTER — Telehealth: Payer: Self-pay

## 2024-08-02 NOTE — Telephone Encounter (Signed)
 Left message on My Chart with lab results per Dr. Madireddy's note. Routed to PCP.

## 2024-08-06 DIAGNOSIS — K219 Gastro-esophageal reflux disease without esophagitis: Secondary | ICD-10-CM | POA: Diagnosis not present

## 2024-08-06 DIAGNOSIS — I130 Hypertensive heart and chronic kidney disease with heart failure and stage 1 through stage 4 chronic kidney disease, or unspecified chronic kidney disease: Secondary | ICD-10-CM | POA: Diagnosis not present

## 2024-08-06 DIAGNOSIS — E782 Mixed hyperlipidemia: Secondary | ICD-10-CM | POA: Diagnosis not present

## 2024-08-06 DIAGNOSIS — E854 Organ-limited amyloidosis: Secondary | ICD-10-CM | POA: Diagnosis not present

## 2024-08-06 DIAGNOSIS — H409 Unspecified glaucoma: Secondary | ICD-10-CM | POA: Diagnosis not present

## 2024-08-06 DIAGNOSIS — M19042 Primary osteoarthritis, left hand: Secondary | ICD-10-CM | POA: Diagnosis not present

## 2024-08-06 DIAGNOSIS — E46 Unspecified protein-calorie malnutrition: Secondary | ICD-10-CM | POA: Diagnosis not present

## 2024-08-06 DIAGNOSIS — D631 Anemia in chronic kidney disease: Secondary | ICD-10-CM | POA: Diagnosis not present

## 2024-08-06 DIAGNOSIS — I083 Combined rheumatic disorders of mitral, aortic and tricuspid valves: Secondary | ICD-10-CM | POA: Diagnosis not present

## 2024-08-06 DIAGNOSIS — I43 Cardiomyopathy in diseases classified elsewhere: Secondary | ICD-10-CM | POA: Diagnosis not present

## 2024-08-06 DIAGNOSIS — N1832 Chronic kidney disease, stage 3b: Secondary | ICD-10-CM | POA: Diagnosis not present

## 2024-08-06 DIAGNOSIS — M109 Gout, unspecified: Secondary | ICD-10-CM | POA: Diagnosis not present

## 2024-08-06 DIAGNOSIS — M19041 Primary osteoarthritis, right hand: Secondary | ICD-10-CM | POA: Diagnosis not present

## 2024-08-06 DIAGNOSIS — I5033 Acute on chronic diastolic (congestive) heart failure: Secondary | ICD-10-CM | POA: Diagnosis not present

## 2024-08-06 DIAGNOSIS — K5730 Diverticulosis of large intestine without perforation or abscess without bleeding: Secondary | ICD-10-CM | POA: Diagnosis not present

## 2024-08-06 DIAGNOSIS — D500 Iron deficiency anemia secondary to blood loss (chronic): Secondary | ICD-10-CM | POA: Diagnosis not present

## 2024-08-06 DIAGNOSIS — I4510 Unspecified right bundle-branch block: Secondary | ICD-10-CM | POA: Diagnosis not present

## 2024-08-06 DIAGNOSIS — D519 Vitamin B12 deficiency anemia, unspecified: Secondary | ICD-10-CM | POA: Diagnosis not present

## 2024-08-06 DIAGNOSIS — I714 Abdominal aortic aneurysm, without rupture, unspecified: Secondary | ICD-10-CM | POA: Diagnosis not present

## 2024-08-06 DIAGNOSIS — J4489 Other specified chronic obstructive pulmonary disease: Secondary | ICD-10-CM | POA: Diagnosis not present

## 2024-08-06 DIAGNOSIS — I161 Hypertensive emergency: Secondary | ICD-10-CM | POA: Diagnosis not present

## 2024-08-06 DIAGNOSIS — E039 Hypothyroidism, unspecified: Secondary | ICD-10-CM | POA: Diagnosis not present

## 2024-08-07 DIAGNOSIS — I130 Hypertensive heart and chronic kidney disease with heart failure and stage 1 through stage 4 chronic kidney disease, or unspecified chronic kidney disease: Secondary | ICD-10-CM | POA: Diagnosis not present

## 2024-08-07 DIAGNOSIS — H409 Unspecified glaucoma: Secondary | ICD-10-CM | POA: Diagnosis not present

## 2024-08-07 DIAGNOSIS — K5730 Diverticulosis of large intestine without perforation or abscess without bleeding: Secondary | ICD-10-CM | POA: Diagnosis not present

## 2024-08-07 DIAGNOSIS — I161 Hypertensive emergency: Secondary | ICD-10-CM | POA: Diagnosis not present

## 2024-08-07 DIAGNOSIS — D519 Vitamin B12 deficiency anemia, unspecified: Secondary | ICD-10-CM | POA: Diagnosis not present

## 2024-08-07 DIAGNOSIS — M109 Gout, unspecified: Secondary | ICD-10-CM | POA: Diagnosis not present

## 2024-08-07 DIAGNOSIS — E46 Unspecified protein-calorie malnutrition: Secondary | ICD-10-CM | POA: Diagnosis not present

## 2024-08-07 DIAGNOSIS — N1832 Chronic kidney disease, stage 3b: Secondary | ICD-10-CM | POA: Diagnosis not present

## 2024-08-07 DIAGNOSIS — M19041 Primary osteoarthritis, right hand: Secondary | ICD-10-CM | POA: Diagnosis not present

## 2024-08-07 DIAGNOSIS — K219 Gastro-esophageal reflux disease without esophagitis: Secondary | ICD-10-CM | POA: Diagnosis not present

## 2024-08-07 DIAGNOSIS — I43 Cardiomyopathy in diseases classified elsewhere: Secondary | ICD-10-CM | POA: Diagnosis not present

## 2024-08-07 DIAGNOSIS — I083 Combined rheumatic disorders of mitral, aortic and tricuspid valves: Secondary | ICD-10-CM | POA: Diagnosis not present

## 2024-08-07 DIAGNOSIS — E039 Hypothyroidism, unspecified: Secondary | ICD-10-CM | POA: Diagnosis not present

## 2024-08-07 DIAGNOSIS — I5033 Acute on chronic diastolic (congestive) heart failure: Secondary | ICD-10-CM | POA: Diagnosis not present

## 2024-08-07 DIAGNOSIS — I4510 Unspecified right bundle-branch block: Secondary | ICD-10-CM | POA: Diagnosis not present

## 2024-08-07 DIAGNOSIS — D500 Iron deficiency anemia secondary to blood loss (chronic): Secondary | ICD-10-CM | POA: Diagnosis not present

## 2024-08-07 DIAGNOSIS — D631 Anemia in chronic kidney disease: Secondary | ICD-10-CM | POA: Diagnosis not present

## 2024-08-07 DIAGNOSIS — M19042 Primary osteoarthritis, left hand: Secondary | ICD-10-CM | POA: Diagnosis not present

## 2024-08-07 DIAGNOSIS — J4489 Other specified chronic obstructive pulmonary disease: Secondary | ICD-10-CM | POA: Diagnosis not present

## 2024-08-07 DIAGNOSIS — I714 Abdominal aortic aneurysm, without rupture, unspecified: Secondary | ICD-10-CM | POA: Diagnosis not present

## 2024-08-07 DIAGNOSIS — E854 Organ-limited amyloidosis: Secondary | ICD-10-CM | POA: Diagnosis not present

## 2024-08-07 DIAGNOSIS — E782 Mixed hyperlipidemia: Secondary | ICD-10-CM | POA: Diagnosis not present

## 2024-08-09 DIAGNOSIS — I5033 Acute on chronic diastolic (congestive) heart failure: Secondary | ICD-10-CM | POA: Diagnosis not present

## 2024-08-09 DIAGNOSIS — I161 Hypertensive emergency: Secondary | ICD-10-CM | POA: Diagnosis not present

## 2024-08-09 DIAGNOSIS — M19041 Primary osteoarthritis, right hand: Secondary | ICD-10-CM | POA: Diagnosis not present

## 2024-08-09 DIAGNOSIS — I083 Combined rheumatic disorders of mitral, aortic and tricuspid valves: Secondary | ICD-10-CM | POA: Diagnosis not present

## 2024-08-09 DIAGNOSIS — D631 Anemia in chronic kidney disease: Secondary | ICD-10-CM | POA: Diagnosis not present

## 2024-08-09 DIAGNOSIS — E854 Organ-limited amyloidosis: Secondary | ICD-10-CM | POA: Diagnosis not present

## 2024-08-09 DIAGNOSIS — D519 Vitamin B12 deficiency anemia, unspecified: Secondary | ICD-10-CM | POA: Diagnosis not present

## 2024-08-09 DIAGNOSIS — I714 Abdominal aortic aneurysm, without rupture, unspecified: Secondary | ICD-10-CM | POA: Diagnosis not present

## 2024-08-09 DIAGNOSIS — I43 Cardiomyopathy in diseases classified elsewhere: Secondary | ICD-10-CM | POA: Diagnosis not present

## 2024-08-09 DIAGNOSIS — M109 Gout, unspecified: Secondary | ICD-10-CM | POA: Diagnosis not present

## 2024-08-09 DIAGNOSIS — N1832 Chronic kidney disease, stage 3b: Secondary | ICD-10-CM | POA: Diagnosis not present

## 2024-08-09 DIAGNOSIS — H409 Unspecified glaucoma: Secondary | ICD-10-CM | POA: Diagnosis not present

## 2024-08-09 DIAGNOSIS — J4489 Other specified chronic obstructive pulmonary disease: Secondary | ICD-10-CM | POA: Diagnosis not present

## 2024-08-09 DIAGNOSIS — E46 Unspecified protein-calorie malnutrition: Secondary | ICD-10-CM | POA: Diagnosis not present

## 2024-08-09 DIAGNOSIS — K5730 Diverticulosis of large intestine without perforation or abscess without bleeding: Secondary | ICD-10-CM | POA: Diagnosis not present

## 2024-08-09 DIAGNOSIS — I4510 Unspecified right bundle-branch block: Secondary | ICD-10-CM | POA: Diagnosis not present

## 2024-08-09 DIAGNOSIS — K219 Gastro-esophageal reflux disease without esophagitis: Secondary | ICD-10-CM | POA: Diagnosis not present

## 2024-08-09 DIAGNOSIS — D500 Iron deficiency anemia secondary to blood loss (chronic): Secondary | ICD-10-CM | POA: Diagnosis not present

## 2024-08-09 DIAGNOSIS — I130 Hypertensive heart and chronic kidney disease with heart failure and stage 1 through stage 4 chronic kidney disease, or unspecified chronic kidney disease: Secondary | ICD-10-CM | POA: Diagnosis not present

## 2024-08-09 DIAGNOSIS — E039 Hypothyroidism, unspecified: Secondary | ICD-10-CM | POA: Diagnosis not present

## 2024-08-09 DIAGNOSIS — M19042 Primary osteoarthritis, left hand: Secondary | ICD-10-CM | POA: Diagnosis not present

## 2024-08-12 ENCOUNTER — Other Ambulatory Visit: Payer: Self-pay

## 2024-08-12 DIAGNOSIS — E46 Unspecified protein-calorie malnutrition: Secondary | ICD-10-CM | POA: Diagnosis not present

## 2024-08-12 DIAGNOSIS — I083 Combined rheumatic disorders of mitral, aortic and tricuspid valves: Secondary | ICD-10-CM | POA: Diagnosis not present

## 2024-08-12 DIAGNOSIS — I161 Hypertensive emergency: Secondary | ICD-10-CM | POA: Diagnosis not present

## 2024-08-12 DIAGNOSIS — H409 Unspecified glaucoma: Secondary | ICD-10-CM | POA: Diagnosis not present

## 2024-08-12 DIAGNOSIS — M19042 Primary osteoarthritis, left hand: Secondary | ICD-10-CM | POA: Diagnosis not present

## 2024-08-12 DIAGNOSIS — J4489 Other specified chronic obstructive pulmonary disease: Secondary | ICD-10-CM | POA: Diagnosis not present

## 2024-08-12 DIAGNOSIS — I4510 Unspecified right bundle-branch block: Secondary | ICD-10-CM | POA: Diagnosis not present

## 2024-08-12 DIAGNOSIS — E782 Mixed hyperlipidemia: Secondary | ICD-10-CM | POA: Diagnosis not present

## 2024-08-12 DIAGNOSIS — I5033 Acute on chronic diastolic (congestive) heart failure: Secondary | ICD-10-CM | POA: Diagnosis not present

## 2024-08-12 DIAGNOSIS — I43 Cardiomyopathy in diseases classified elsewhere: Secondary | ICD-10-CM | POA: Diagnosis not present

## 2024-08-12 DIAGNOSIS — K5730 Diverticulosis of large intestine without perforation or abscess without bleeding: Secondary | ICD-10-CM | POA: Diagnosis not present

## 2024-08-12 DIAGNOSIS — E039 Hypothyroidism, unspecified: Secondary | ICD-10-CM | POA: Diagnosis not present

## 2024-08-12 DIAGNOSIS — M109 Gout, unspecified: Secondary | ICD-10-CM | POA: Diagnosis not present

## 2024-08-12 DIAGNOSIS — K219 Gastro-esophageal reflux disease without esophagitis: Secondary | ICD-10-CM | POA: Diagnosis not present

## 2024-08-12 DIAGNOSIS — E854 Organ-limited amyloidosis: Secondary | ICD-10-CM | POA: Diagnosis not present

## 2024-08-12 DIAGNOSIS — N1832 Chronic kidney disease, stage 3b: Secondary | ICD-10-CM | POA: Diagnosis not present

## 2024-08-12 DIAGNOSIS — M19041 Primary osteoarthritis, right hand: Secondary | ICD-10-CM | POA: Diagnosis not present

## 2024-08-12 DIAGNOSIS — I130 Hypertensive heart and chronic kidney disease with heart failure and stage 1 through stage 4 chronic kidney disease, or unspecified chronic kidney disease: Secondary | ICD-10-CM | POA: Diagnosis not present

## 2024-08-12 DIAGNOSIS — D631 Anemia in chronic kidney disease: Secondary | ICD-10-CM | POA: Diagnosis not present

## 2024-08-12 DIAGNOSIS — D500 Iron deficiency anemia secondary to blood loss (chronic): Secondary | ICD-10-CM | POA: Diagnosis not present

## 2024-08-12 DIAGNOSIS — D519 Vitamin B12 deficiency anemia, unspecified: Secondary | ICD-10-CM | POA: Diagnosis not present

## 2024-08-12 DIAGNOSIS — I714 Abdominal aortic aneurysm, without rupture, unspecified: Secondary | ICD-10-CM | POA: Diagnosis not present

## 2024-08-14 DIAGNOSIS — D631 Anemia in chronic kidney disease: Secondary | ICD-10-CM | POA: Diagnosis not present

## 2024-08-14 DIAGNOSIS — I083 Combined rheumatic disorders of mitral, aortic and tricuspid valves: Secondary | ICD-10-CM | POA: Diagnosis not present

## 2024-08-14 DIAGNOSIS — I43 Cardiomyopathy in diseases classified elsewhere: Secondary | ICD-10-CM | POA: Diagnosis not present

## 2024-08-14 DIAGNOSIS — E46 Unspecified protein-calorie malnutrition: Secondary | ICD-10-CM | POA: Diagnosis not present

## 2024-08-14 DIAGNOSIS — I5033 Acute on chronic diastolic (congestive) heart failure: Secondary | ICD-10-CM | POA: Diagnosis not present

## 2024-08-14 DIAGNOSIS — E782 Mixed hyperlipidemia: Secondary | ICD-10-CM | POA: Diagnosis not present

## 2024-08-14 DIAGNOSIS — D519 Vitamin B12 deficiency anemia, unspecified: Secondary | ICD-10-CM | POA: Diagnosis not present

## 2024-08-14 DIAGNOSIS — K219 Gastro-esophageal reflux disease without esophagitis: Secondary | ICD-10-CM | POA: Diagnosis not present

## 2024-08-14 DIAGNOSIS — I161 Hypertensive emergency: Secondary | ICD-10-CM | POA: Diagnosis not present

## 2024-08-14 DIAGNOSIS — I4510 Unspecified right bundle-branch block: Secondary | ICD-10-CM | POA: Diagnosis not present

## 2024-08-14 DIAGNOSIS — K5730 Diverticulosis of large intestine without perforation or abscess without bleeding: Secondary | ICD-10-CM | POA: Diagnosis not present

## 2024-08-14 DIAGNOSIS — N1832 Chronic kidney disease, stage 3b: Secondary | ICD-10-CM | POA: Diagnosis not present

## 2024-08-14 DIAGNOSIS — I714 Abdominal aortic aneurysm, without rupture, unspecified: Secondary | ICD-10-CM | POA: Diagnosis not present

## 2024-08-14 DIAGNOSIS — H409 Unspecified glaucoma: Secondary | ICD-10-CM | POA: Diagnosis not present

## 2024-08-14 DIAGNOSIS — E039 Hypothyroidism, unspecified: Secondary | ICD-10-CM | POA: Diagnosis not present

## 2024-08-14 DIAGNOSIS — D500 Iron deficiency anemia secondary to blood loss (chronic): Secondary | ICD-10-CM | POA: Diagnosis not present

## 2024-08-14 DIAGNOSIS — J4489 Other specified chronic obstructive pulmonary disease: Secondary | ICD-10-CM | POA: Diagnosis not present

## 2024-08-14 DIAGNOSIS — M19041 Primary osteoarthritis, right hand: Secondary | ICD-10-CM | POA: Diagnosis not present

## 2024-08-14 DIAGNOSIS — M19042 Primary osteoarthritis, left hand: Secondary | ICD-10-CM | POA: Diagnosis not present

## 2024-08-14 DIAGNOSIS — M109 Gout, unspecified: Secondary | ICD-10-CM | POA: Diagnosis not present

## 2024-08-14 DIAGNOSIS — E854 Organ-limited amyloidosis: Secondary | ICD-10-CM | POA: Diagnosis not present

## 2024-08-14 DIAGNOSIS — I130 Hypertensive heart and chronic kidney disease with heart failure and stage 1 through stage 4 chronic kidney disease, or unspecified chronic kidney disease: Secondary | ICD-10-CM | POA: Diagnosis not present

## 2024-08-20 DIAGNOSIS — I43 Cardiomyopathy in diseases classified elsewhere: Secondary | ICD-10-CM | POA: Diagnosis not present

## 2024-08-20 DIAGNOSIS — M19041 Primary osteoarthritis, right hand: Secondary | ICD-10-CM | POA: Diagnosis not present

## 2024-08-20 DIAGNOSIS — M19042 Primary osteoarthritis, left hand: Secondary | ICD-10-CM | POA: Diagnosis not present

## 2024-08-20 DIAGNOSIS — E854 Organ-limited amyloidosis: Secondary | ICD-10-CM | POA: Diagnosis not present

## 2024-08-20 DIAGNOSIS — D519 Vitamin B12 deficiency anemia, unspecified: Secondary | ICD-10-CM | POA: Diagnosis not present

## 2024-08-20 DIAGNOSIS — I083 Combined rheumatic disorders of mitral, aortic and tricuspid valves: Secondary | ICD-10-CM | POA: Diagnosis not present

## 2024-08-20 DIAGNOSIS — D500 Iron deficiency anemia secondary to blood loss (chronic): Secondary | ICD-10-CM | POA: Diagnosis not present

## 2024-08-20 DIAGNOSIS — I5033 Acute on chronic diastolic (congestive) heart failure: Secondary | ICD-10-CM | POA: Diagnosis not present

## 2024-08-21 DIAGNOSIS — I5032 Chronic diastolic (congestive) heart failure: Secondary | ICD-10-CM | POA: Diagnosis not present

## 2024-08-21 DIAGNOSIS — Z743 Need for continuous supervision: Secondary | ICD-10-CM | POA: Diagnosis not present

## 2024-08-21 DIAGNOSIS — R531 Weakness: Secondary | ICD-10-CM | POA: Diagnosis not present

## 2024-08-21 DIAGNOSIS — R918 Other nonspecific abnormal finding of lung field: Secondary | ICD-10-CM | POA: Diagnosis not present

## 2024-08-21 DIAGNOSIS — Z79899 Other long term (current) drug therapy: Secondary | ICD-10-CM | POA: Diagnosis not present

## 2024-08-21 DIAGNOSIS — R55 Syncope and collapse: Secondary | ICD-10-CM | POA: Diagnosis not present

## 2024-08-21 DIAGNOSIS — I110 Hypertensive heart disease with heart failure: Secondary | ICD-10-CM | POA: Diagnosis not present

## 2024-08-21 DIAGNOSIS — I4510 Unspecified right bundle-branch block: Secondary | ICD-10-CM | POA: Diagnosis not present

## 2024-08-21 DIAGNOSIS — J449 Chronic obstructive pulmonary disease, unspecified: Secondary | ICD-10-CM | POA: Diagnosis not present

## 2024-08-22 DIAGNOSIS — R918 Other nonspecific abnormal finding of lung field: Secondary | ICD-10-CM | POA: Diagnosis not present

## 2024-08-23 DIAGNOSIS — H409 Unspecified glaucoma: Secondary | ICD-10-CM | POA: Diagnosis not present

## 2024-08-23 DIAGNOSIS — E782 Mixed hyperlipidemia: Secondary | ICD-10-CM | POA: Diagnosis not present

## 2024-08-23 DIAGNOSIS — M109 Gout, unspecified: Secondary | ICD-10-CM | POA: Diagnosis not present

## 2024-08-23 DIAGNOSIS — E46 Unspecified protein-calorie malnutrition: Secondary | ICD-10-CM | POA: Diagnosis not present

## 2024-08-23 DIAGNOSIS — I43 Cardiomyopathy in diseases classified elsewhere: Secondary | ICD-10-CM | POA: Diagnosis not present

## 2024-08-23 DIAGNOSIS — I130 Hypertensive heart and chronic kidney disease with heart failure and stage 1 through stage 4 chronic kidney disease, or unspecified chronic kidney disease: Secondary | ICD-10-CM | POA: Diagnosis not present

## 2024-08-23 DIAGNOSIS — I714 Abdominal aortic aneurysm, without rupture, unspecified: Secondary | ICD-10-CM | POA: Diagnosis not present

## 2024-08-23 DIAGNOSIS — D631 Anemia in chronic kidney disease: Secondary | ICD-10-CM | POA: Diagnosis not present

## 2024-08-23 DIAGNOSIS — D500 Iron deficiency anemia secondary to blood loss (chronic): Secondary | ICD-10-CM | POA: Diagnosis not present

## 2024-08-23 DIAGNOSIS — M19041 Primary osteoarthritis, right hand: Secondary | ICD-10-CM | POA: Diagnosis not present

## 2024-08-23 DIAGNOSIS — E039 Hypothyroidism, unspecified: Secondary | ICD-10-CM | POA: Diagnosis not present

## 2024-08-23 DIAGNOSIS — I5033 Acute on chronic diastolic (congestive) heart failure: Secondary | ICD-10-CM | POA: Diagnosis not present

## 2024-08-23 DIAGNOSIS — I4510 Unspecified right bundle-branch block: Secondary | ICD-10-CM | POA: Diagnosis not present

## 2024-08-23 DIAGNOSIS — N1832 Chronic kidney disease, stage 3b: Secondary | ICD-10-CM | POA: Diagnosis not present

## 2024-08-23 DIAGNOSIS — I161 Hypertensive emergency: Secondary | ICD-10-CM | POA: Diagnosis not present

## 2024-08-23 DIAGNOSIS — K219 Gastro-esophageal reflux disease without esophagitis: Secondary | ICD-10-CM | POA: Diagnosis not present

## 2024-08-23 DIAGNOSIS — E854 Organ-limited amyloidosis: Secondary | ICD-10-CM | POA: Diagnosis not present

## 2024-08-23 DIAGNOSIS — K5730 Diverticulosis of large intestine without perforation or abscess without bleeding: Secondary | ICD-10-CM | POA: Diagnosis not present

## 2024-08-23 DIAGNOSIS — I083 Combined rheumatic disorders of mitral, aortic and tricuspid valves: Secondary | ICD-10-CM | POA: Diagnosis not present

## 2024-08-23 DIAGNOSIS — M19042 Primary osteoarthritis, left hand: Secondary | ICD-10-CM | POA: Diagnosis not present

## 2024-08-23 DIAGNOSIS — D519 Vitamin B12 deficiency anemia, unspecified: Secondary | ICD-10-CM | POA: Diagnosis not present

## 2024-08-23 DIAGNOSIS — J4489 Other specified chronic obstructive pulmonary disease: Secondary | ICD-10-CM | POA: Diagnosis not present

## 2024-08-26 DIAGNOSIS — K5730 Diverticulosis of large intestine without perforation or abscess without bleeding: Secondary | ICD-10-CM | POA: Diagnosis not present

## 2024-08-26 DIAGNOSIS — E782 Mixed hyperlipidemia: Secondary | ICD-10-CM | POA: Diagnosis not present

## 2024-08-26 DIAGNOSIS — I5033 Acute on chronic diastolic (congestive) heart failure: Secondary | ICD-10-CM | POA: Diagnosis not present

## 2024-08-26 DIAGNOSIS — I43 Cardiomyopathy in diseases classified elsewhere: Secondary | ICD-10-CM | POA: Diagnosis not present

## 2024-08-26 DIAGNOSIS — I083 Combined rheumatic disorders of mitral, aortic and tricuspid valves: Secondary | ICD-10-CM | POA: Diagnosis not present

## 2024-08-26 DIAGNOSIS — D500 Iron deficiency anemia secondary to blood loss (chronic): Secondary | ICD-10-CM | POA: Diagnosis not present

## 2024-08-26 DIAGNOSIS — I714 Abdominal aortic aneurysm, without rupture, unspecified: Secondary | ICD-10-CM | POA: Diagnosis not present

## 2024-08-26 DIAGNOSIS — M109 Gout, unspecified: Secondary | ICD-10-CM | POA: Diagnosis not present

## 2024-08-26 DIAGNOSIS — E039 Hypothyroidism, unspecified: Secondary | ICD-10-CM | POA: Diagnosis not present

## 2024-08-26 DIAGNOSIS — M19041 Primary osteoarthritis, right hand: Secondary | ICD-10-CM | POA: Diagnosis not present

## 2024-08-26 DIAGNOSIS — M19042 Primary osteoarthritis, left hand: Secondary | ICD-10-CM | POA: Diagnosis not present

## 2024-08-26 DIAGNOSIS — I161 Hypertensive emergency: Secondary | ICD-10-CM | POA: Diagnosis not present

## 2024-08-26 DIAGNOSIS — D631 Anemia in chronic kidney disease: Secondary | ICD-10-CM | POA: Diagnosis not present

## 2024-08-26 DIAGNOSIS — E46 Unspecified protein-calorie malnutrition: Secondary | ICD-10-CM | POA: Diagnosis not present

## 2024-08-26 DIAGNOSIS — N1832 Chronic kidney disease, stage 3b: Secondary | ICD-10-CM | POA: Diagnosis not present

## 2024-08-26 DIAGNOSIS — E854 Organ-limited amyloidosis: Secondary | ICD-10-CM | POA: Diagnosis not present

## 2024-08-26 DIAGNOSIS — I4510 Unspecified right bundle-branch block: Secondary | ICD-10-CM | POA: Diagnosis not present

## 2024-08-26 DIAGNOSIS — J4489 Other specified chronic obstructive pulmonary disease: Secondary | ICD-10-CM | POA: Diagnosis not present

## 2024-08-26 DIAGNOSIS — K219 Gastro-esophageal reflux disease without esophagitis: Secondary | ICD-10-CM | POA: Diagnosis not present

## 2024-08-26 DIAGNOSIS — D519 Vitamin B12 deficiency anemia, unspecified: Secondary | ICD-10-CM | POA: Diagnosis not present

## 2024-08-26 DIAGNOSIS — I130 Hypertensive heart and chronic kidney disease with heart failure and stage 1 through stage 4 chronic kidney disease, or unspecified chronic kidney disease: Secondary | ICD-10-CM | POA: Diagnosis not present

## 2024-08-26 DIAGNOSIS — H409 Unspecified glaucoma: Secondary | ICD-10-CM | POA: Diagnosis not present

## 2024-08-29 ENCOUNTER — Ambulatory Visit

## 2024-08-29 VITALS — BP 160/90 | HR 100 | Ht 65.0 in | Wt 112.0 lb

## 2024-08-29 DIAGNOSIS — I5032 Chronic diastolic (congestive) heart failure: Secondary | ICD-10-CM | POA: Diagnosis not present

## 2024-08-29 DIAGNOSIS — I10 Essential (primary) hypertension: Secondary | ICD-10-CM | POA: Diagnosis not present

## 2024-08-29 NOTE — Patient Instructions (Addendum)
 Medication Instructions:  Your physician recommends that you continue on your current medications as directed. Please refer to the Current Medication list given to you today.  *If you need a refill on your cardiac medications before your next appointment, please call your pharmacy*   Lab Work: None Ordered If you have labs (blood work) drawn today and your tests are completely normal, you will receive your results only by: MyChart Message (if you have MyChart) OR A paper copy in the mail If you have any lab test that is abnormal or we need to change your treatment, we will call you to review the results.   Testing/Procedures: None Ordered   Follow-Up: At [facility], you and your health needs are our priority.  As part of our continuing mission to provide you with exceptional heart care, we have created designated Provider Care Teams.  These Care Teams include your primary Cardiologist (physician) and Advanced Practice Providers (APPs -  Physician Assistants and Nurse Practitioners) who all work together to provide you with the care you need, when you need it.  We recommend signing up for the patient portal called MyChart.  Sign up information is provided on this After Visit Summary.  MyChart is used to connect with patients for Virtual Visits (Telemedicine).  Patients are able to view lab/test results, encounter notes, upcoming appointments, etc.  Non-urgent messages can be sent to your provider as well.   To learn more about what you can do with MyChart, go to [email or web address].    Your next appointment:   3 month(s)  The format for your next appointment:   In Person  Provider:   Bertha Broad, MD    Other Instructions NA

## 2024-08-29 NOTE — Assessment & Plan Note (Signed)
 Appears compensated and euvolemic. Continue furosemide 20 mg once daily. Continue salt restriction below 2 g/day.  Continue blood pressure control with amlodipine 5 mg once daily. Beta-blocker carvedilol was discontinued as she was having low blood pressures and symptomatic with lightheadedness and falls.

## 2024-08-29 NOTE — Assessment & Plan Note (Signed)
 Suboptimal blood pressure. Per daughter at home blood pressure log numbers are systolic around 140s.  Advised them to bring the device to get validated at PCPs office or at our office.  Continue amlodipine 5 mg once daily. Carvedilol was discontinued at her recent follow-up in [place] [facility] due to low blood pressures observed after sustaining a fall about a week ago.  Target blood pressure below 150/80 mmHg considering her elderly age and tendency for orthostatic blood pressure changes lightheadedness and falls.

## 2024-08-29 NOTE — Progress Notes (Addendum)
 Cardiology Consultation:    Date:  08/29/2024   ID:  Rachel Ho, DOB 12-09-1934, MRN [id number]  PCP:  Rachel Marcellus RAMAN, MD  Cardiologist:  Rachel SAUNDERS Ramla Hase, MD   Referring MD: Rachel Marcellus RAMAN, MD   Chief Complaint  Patient presents with   Follow-up     ASSESSMENT AND PLAN:   Rachel Ho 89 year old woman  history of chronic diastolic CHF with preserved LVEF [EF 55 to 60%, moderate concentric LVH on TTE 07/04/2024 at [facility], hypertension, asthma, AAA s/p endovascular repair 07/24/2020, anemia, COPD, diverticulosis, chronic small pericardial effusion and remote history of pericarditis, CKD stage III.   Problem List Items Addressed This Visit     Hypertension   Suboptimal blood pressure. Per daughter at home blood pressure log numbers are systolic around 140s.  Advised them to bring the device to get validated at PCPs office or at our office.  Continue amlodipine 5 mg once daily. Carvedilol was discontinued at her recent follow-up in [place] ER due to low blood pressures observed after sustaining a fall about a week ago.  Target blood pressure below 150/80 mmHg considering her elderly age and tendency for orthostatic blood pressure changes lightheadedness and falls.      Diastolic congestive heart failure (HCC) - Primary   Appears compensated and euvolemic. Continue furosemide 20 mg once daily. Continue salt restriction below 2 g/day.  Continue blood pressure control with amlodipine 5 mg once daily. Beta-blocker carvedilol was discontinued as she was having low blood pressures and symptomatic with lightheadedness and falls.       Return to clinic tentatively in 3 months.  Addendum 09/12/2024: Blood pressure log from 07/31/2024 through 08/29/2024 reviewed. Blood pressures since discontinuing carvedilol have been somewhat elevated with systolics consistently above 150 mmHg.  With elevated heart rates in 90s. Would recommend resuming low-dose  carvedilol 3.125 mg twice daily.  History of Present Illness:    Rachel Ho is a 89 y.o. female who is being seen today for follow-up visit. PCP is Rachel Marcellus RAMAN, MD. Last visit with me in the office was 07/30/2024.  Lives at home by herself and family members take turns staying with her and take care of her.  Son Rachel Ho and daughter Rachel Ho and other family members are involved in the care. Here for the visit today accompanied by her daughter Rachel Ho.   history of chronic diastolic CHF with preserved LVEF [EF 55 to 60%, moderate concentric LVH on TTE 07/04/2024 at [facility], hypertension, asthma, AAA s/p endovascular repair 07/24/2020, anemia, COPD, diverticulosis, chronic small pericardial effusion and remote history of pericarditis, CKD stage III.   Had an admission at [facility] 07/03/2024 for acute heart failure with preserved ejection fraction, hypertensive urgency. Subsequently seen for follow-up visit on 07/30/2024.  Blood work done after the office visit noted magnesium  levels 2.6 BUN 25, creatinine 1.29, eGFR 40. Sodium 143 and potassium 4.5.  About a week ago with lightheadedness and near falling down episodes she was taken to the ER at [facility]. Carvedilol was discontinued due to low blood pressures.  Mentions at home she is able to get up and ambulate and to the restroom and back by herself able to take care of herself.  No other significant physical exertion.  Recently daughter took her to the grocery store and she was able to ambulate taking breaks frequently using her cane and walker.  Denies any syncopal episodes. Denies any blood in urine or stools.  Past Medical History:  Diagnosis Date   Abdominal aortic aneurysm (AAA) without rupture 04/22/2022   AKI (acute kidney injury) 09/15/2020   Allergic rhinitis 09/16/2015   Asthma    COPD (chronic obstructive pulmonary disease) (HCC)    Dyslipidemia 06/15/2015    Generalized weakness    GERD (gastroesophageal reflux disease)    High blood pressure    Nonallergic rhinitis 09/16/2015   Protein malnutrition 09/15/2020    Past Surgical History:  Procedure Laterality Date   ABDOMINAL HYSTERECTOMY  1984   HERNIA REPAIR  1984    Current Medications: Current Meds  Medication Sig   amLODipine (NORVASC) 5 MG tablet Take 5 mg by mouth daily.   Cholecalciferol (VITAMIN D3) 50 MCG (2000 UT) capsule Take 2,000 Units by mouth daily.   feeding supplement (ENSURE ENLIVE / ENSURE PLUS) LIQD Take 237 mLs by mouth 2 (two) times daily between meals.   furosemide (LASIX) 20 MG tablet Take 20 mg by mouth daily.   loratadine (CLARITIN) 10 MG tablet Take 10 mg by mouth daily as needed for allergies.   Multiple Vitamins-Calcium (ONE-A-DAY WOMENS PO) Take by mouth daily.   nitroGLYCERIN  (NITROSTAT ) 0.4 MG SL tablet Place 0.4 mg under the tongue.   potassium chloride  SA (KLOR-CON ) 10 MEQ tablet Take 1 tablet (10 mEq total) by mouth every other day.   PROAIR  HFA 108 (90 Base) MCG/ACT inhaler Inhale two puffs every four to six hours as needed for cough or wheeze.   SYMBICORT  160-4.5 MCG/ACT inhaler INHALE 2 PUFFS BY MOUTH 2 TIMES A DAY     Allergies:   Latex, Other, Sweet potato, Tomato, Sulfa antibiotics, and Sulfasalazine   Social History   Socioeconomic History   Marital status: Single    Spouse name: Not on file   Number of children: Not on file   Years of education: Not on file   Highest education level: Not on file  Occupational History   Occupation: retired  Tobacco Use   Smoking status: Former    Current packs/day: 0.00    Average packs/day: 2.0 packs/day for 25.0 years (50.0 ttl pk-yrs)    Types: Cigarettes    Start date: 12/06/1943    Quit date: 12/05/1968    Years since quitting: 55.7   Smokeless tobacco: Never  Vaping Use   Vaping status: Never Used  Substance and Sexual Activity   Alcohol use: No    Alcohol/week: 0.0 standard drinks of alcohol    Drug use: No   Sexual activity: Not Currently  Other Topics Concern   Not on file  Social History Narrative   Not on file   Social Drivers of Health   Financial Resource Strain: Not on file  Food Insecurity: Not on file  Transportation Needs: Not on file  Physical Activity: Not on file  Stress: Not on file  Social Connections: Not on file     Family History: The patient's family history includes High blood pressure in her mother. ROS:   Please see the history of present illness.    All 14 point review of systems negative except as described per history of present illness.  EKGs/Labs/Other Studies Reviewed:    The following studies were reviewed today:   EKG:       Recent Labs: 07/30/2024: BUN 25; Creatinine, Ser 1.29; Magnesium  2.6; Potassium 4.5; Sodium 143  Recent Lipid Panel No results found for: CHOL, TRIG, HDL, CHOLHDL, VLDL, LDLCALC, LDLDIRECT  Physical Exam:    VS:  BP (!) 160/90  Pulse 100   Ht 5' 5 (1.651 m)   Wt 112 lb (50.8 kg)   SpO2 90%   BMI 18.64 kg/m     Wt Readings from Last 3 Encounters:  08/29/24 112 lb (50.8 kg)  07/30/24 117 lb (53.1 kg)  09/20/20 110 lb 3.7 oz (50 kg)     GENERAL:  Well nourished, well developed in no acute distress NECK: No JVD; No carotid bruits CARDIAC: RRR, S1 and S2 present, no murmurs, no rubs, no gallops CHEST:  Clear to auscultation without rales, wheezing or rhonchi  Extremities: No pitting pedal edema. Pulses bilaterally symmetric with radial 2+ and dorsalis pedis 2+ NEUROLOGIC:  Alert and oriented x 3  Medication Adjustments/Labs and Tests Ordered: Current medicines are reviewed at length with the patient today.  Concerns regarding medicines are outlined above.  No orders of the defined types were placed in this encounter.  No orders of the defined types were placed in this encounter.   Signed, Rachel jess Kobus, MD, MPH, [facility]. 08/29/2024 10:59 AM    [facility]

## 2024-08-30 DIAGNOSIS — J4489 Other specified chronic obstructive pulmonary disease: Secondary | ICD-10-CM | POA: Diagnosis not present

## 2024-08-30 DIAGNOSIS — I083 Combined rheumatic disorders of mitral, aortic and tricuspid valves: Secondary | ICD-10-CM | POA: Diagnosis not present

## 2024-08-30 DIAGNOSIS — I5033 Acute on chronic diastolic (congestive) heart failure: Secondary | ICD-10-CM | POA: Diagnosis not present

## 2024-08-30 DIAGNOSIS — E854 Organ-limited amyloidosis: Secondary | ICD-10-CM | POA: Diagnosis not present

## 2024-08-30 DIAGNOSIS — M19041 Primary osteoarthritis, right hand: Secondary | ICD-10-CM | POA: Diagnosis not present

## 2024-08-30 DIAGNOSIS — I43 Cardiomyopathy in diseases classified elsewhere: Secondary | ICD-10-CM | POA: Diagnosis not present

## 2024-08-30 DIAGNOSIS — D500 Iron deficiency anemia secondary to blood loss (chronic): Secondary | ICD-10-CM | POA: Diagnosis not present

## 2024-08-30 DIAGNOSIS — M109 Gout, unspecified: Secondary | ICD-10-CM | POA: Diagnosis not present

## 2024-08-30 DIAGNOSIS — I714 Abdominal aortic aneurysm, without rupture, unspecified: Secondary | ICD-10-CM | POA: Diagnosis not present

## 2024-08-30 DIAGNOSIS — D519 Vitamin B12 deficiency anemia, unspecified: Secondary | ICD-10-CM | POA: Diagnosis not present

## 2024-08-30 DIAGNOSIS — I130 Hypertensive heart and chronic kidney disease with heart failure and stage 1 through stage 4 chronic kidney disease, or unspecified chronic kidney disease: Secondary | ICD-10-CM | POA: Diagnosis not present

## 2024-08-30 DIAGNOSIS — K219 Gastro-esophageal reflux disease without esophagitis: Secondary | ICD-10-CM | POA: Diagnosis not present

## 2024-08-30 DIAGNOSIS — D631 Anemia in chronic kidney disease: Secondary | ICD-10-CM | POA: Diagnosis not present

## 2024-08-30 DIAGNOSIS — K5730 Diverticulosis of large intestine without perforation or abscess without bleeding: Secondary | ICD-10-CM | POA: Diagnosis not present

## 2024-08-30 DIAGNOSIS — I4510 Unspecified right bundle-branch block: Secondary | ICD-10-CM | POA: Diagnosis not present

## 2024-08-30 DIAGNOSIS — I161 Hypertensive emergency: Secondary | ICD-10-CM | POA: Diagnosis not present

## 2024-08-30 DIAGNOSIS — E46 Unspecified protein-calorie malnutrition: Secondary | ICD-10-CM | POA: Diagnosis not present

## 2024-08-30 DIAGNOSIS — M19042 Primary osteoarthritis, left hand: Secondary | ICD-10-CM | POA: Diagnosis not present

## 2024-08-30 DIAGNOSIS — H409 Unspecified glaucoma: Secondary | ICD-10-CM | POA: Diagnosis not present

## 2024-08-30 DIAGNOSIS — E039 Hypothyroidism, unspecified: Secondary | ICD-10-CM | POA: Diagnosis not present

## 2024-08-30 DIAGNOSIS — E782 Mixed hyperlipidemia: Secondary | ICD-10-CM | POA: Diagnosis not present

## 2024-08-30 DIAGNOSIS — N1832 Chronic kidney disease, stage 3b: Secondary | ICD-10-CM | POA: Diagnosis not present

## 2024-09-02 ENCOUNTER — Ambulatory Visit

## 2024-09-02 DIAGNOSIS — E46 Unspecified protein-calorie malnutrition: Secondary | ICD-10-CM | POA: Diagnosis not present

## 2024-09-02 DIAGNOSIS — K5730 Diverticulosis of large intestine without perforation or abscess without bleeding: Secondary | ICD-10-CM | POA: Diagnosis not present

## 2024-09-02 DIAGNOSIS — E782 Mixed hyperlipidemia: Secondary | ICD-10-CM | POA: Diagnosis not present

## 2024-09-02 DIAGNOSIS — N1832 Chronic kidney disease, stage 3b: Secondary | ICD-10-CM | POA: Diagnosis not present

## 2024-09-02 DIAGNOSIS — K219 Gastro-esophageal reflux disease without esophagitis: Secondary | ICD-10-CM | POA: Diagnosis not present

## 2024-09-02 DIAGNOSIS — D500 Iron deficiency anemia secondary to blood loss (chronic): Secondary | ICD-10-CM | POA: Diagnosis not present

## 2024-09-02 DIAGNOSIS — M19041 Primary osteoarthritis, right hand: Secondary | ICD-10-CM | POA: Diagnosis not present

## 2024-09-02 DIAGNOSIS — M19042 Primary osteoarthritis, left hand: Secondary | ICD-10-CM | POA: Diagnosis not present

## 2024-09-02 DIAGNOSIS — J4489 Other specified chronic obstructive pulmonary disease: Secondary | ICD-10-CM | POA: Diagnosis not present

## 2024-09-02 DIAGNOSIS — E854 Organ-limited amyloidosis: Secondary | ICD-10-CM | POA: Diagnosis not present

## 2024-09-02 DIAGNOSIS — I43 Cardiomyopathy in diseases classified elsewhere: Secondary | ICD-10-CM | POA: Diagnosis not present

## 2024-09-02 DIAGNOSIS — I714 Abdominal aortic aneurysm, without rupture, unspecified: Secondary | ICD-10-CM | POA: Diagnosis not present

## 2024-09-02 DIAGNOSIS — H409 Unspecified glaucoma: Secondary | ICD-10-CM | POA: Diagnosis not present

## 2024-09-02 DIAGNOSIS — D631 Anemia in chronic kidney disease: Secondary | ICD-10-CM | POA: Diagnosis not present

## 2024-09-02 DIAGNOSIS — E039 Hypothyroidism, unspecified: Secondary | ICD-10-CM | POA: Diagnosis not present

## 2024-09-02 DIAGNOSIS — M109 Gout, unspecified: Secondary | ICD-10-CM | POA: Diagnosis not present

## 2024-09-02 DIAGNOSIS — I130 Hypertensive heart and chronic kidney disease with heart failure and stage 1 through stage 4 chronic kidney disease, or unspecified chronic kidney disease: Secondary | ICD-10-CM | POA: Diagnosis not present

## 2024-09-02 DIAGNOSIS — D519 Vitamin B12 deficiency anemia, unspecified: Secondary | ICD-10-CM | POA: Diagnosis not present

## 2024-09-02 DIAGNOSIS — I161 Hypertensive emergency: Secondary | ICD-10-CM | POA: Diagnosis not present

## 2024-09-02 DIAGNOSIS — I083 Combined rheumatic disorders of mitral, aortic and tricuspid valves: Secondary | ICD-10-CM | POA: Diagnosis not present

## 2024-09-02 DIAGNOSIS — I4510 Unspecified right bundle-branch block: Secondary | ICD-10-CM | POA: Diagnosis not present

## 2024-09-02 DIAGNOSIS — I5033 Acute on chronic diastolic (congestive) heart failure: Secondary | ICD-10-CM | POA: Diagnosis not present

## 2024-09-06 DIAGNOSIS — K5730 Diverticulosis of large intestine without perforation or abscess without bleeding: Secondary | ICD-10-CM | POA: Diagnosis not present

## 2024-09-06 DIAGNOSIS — E854 Organ-limited amyloidosis: Secondary | ICD-10-CM | POA: Diagnosis not present

## 2024-09-06 DIAGNOSIS — H409 Unspecified glaucoma: Secondary | ICD-10-CM | POA: Diagnosis not present

## 2024-09-06 DIAGNOSIS — I161 Hypertensive emergency: Secondary | ICD-10-CM | POA: Diagnosis not present

## 2024-09-06 DIAGNOSIS — M19041 Primary osteoarthritis, right hand: Secondary | ICD-10-CM | POA: Diagnosis not present

## 2024-09-06 DIAGNOSIS — J4489 Other specified chronic obstructive pulmonary disease: Secondary | ICD-10-CM | POA: Diagnosis not present

## 2024-09-06 DIAGNOSIS — D631 Anemia in chronic kidney disease: Secondary | ICD-10-CM | POA: Diagnosis not present

## 2024-09-06 DIAGNOSIS — I5033 Acute on chronic diastolic (congestive) heart failure: Secondary | ICD-10-CM | POA: Diagnosis not present

## 2024-09-06 DIAGNOSIS — E782 Mixed hyperlipidemia: Secondary | ICD-10-CM | POA: Diagnosis not present

## 2024-09-06 DIAGNOSIS — I4510 Unspecified right bundle-branch block: Secondary | ICD-10-CM | POA: Diagnosis not present

## 2024-09-06 DIAGNOSIS — D519 Vitamin B12 deficiency anemia, unspecified: Secondary | ICD-10-CM | POA: Diagnosis not present

## 2024-09-06 DIAGNOSIS — E46 Unspecified protein-calorie malnutrition: Secondary | ICD-10-CM | POA: Diagnosis not present

## 2024-09-06 DIAGNOSIS — K219 Gastro-esophageal reflux disease without esophagitis: Secondary | ICD-10-CM | POA: Diagnosis not present

## 2024-09-06 DIAGNOSIS — I083 Combined rheumatic disorders of mitral, aortic and tricuspid valves: Secondary | ICD-10-CM | POA: Diagnosis not present

## 2024-09-06 DIAGNOSIS — I130 Hypertensive heart and chronic kidney disease with heart failure and stage 1 through stage 4 chronic kidney disease, or unspecified chronic kidney disease: Secondary | ICD-10-CM | POA: Diagnosis not present

## 2024-09-06 DIAGNOSIS — D500 Iron deficiency anemia secondary to blood loss (chronic): Secondary | ICD-10-CM | POA: Diagnosis not present

## 2024-09-06 DIAGNOSIS — M19042 Primary osteoarthritis, left hand: Secondary | ICD-10-CM | POA: Diagnosis not present

## 2024-09-06 DIAGNOSIS — I714 Abdominal aortic aneurysm, without rupture, unspecified: Secondary | ICD-10-CM | POA: Diagnosis not present

## 2024-09-06 DIAGNOSIS — N1832 Chronic kidney disease, stage 3b: Secondary | ICD-10-CM | POA: Diagnosis not present

## 2024-09-06 DIAGNOSIS — I43 Cardiomyopathy in diseases classified elsewhere: Secondary | ICD-10-CM | POA: Diagnosis not present

## 2024-09-06 DIAGNOSIS — M109 Gout, unspecified: Secondary | ICD-10-CM | POA: Diagnosis not present

## 2024-09-06 DIAGNOSIS — E039 Hypothyroidism, unspecified: Secondary | ICD-10-CM | POA: Diagnosis not present

## 2024-09-11 DIAGNOSIS — E039 Hypothyroidism, unspecified: Secondary | ICD-10-CM | POA: Diagnosis not present

## 2024-09-11 DIAGNOSIS — I5033 Acute on chronic diastolic (congestive) heart failure: Secondary | ICD-10-CM | POA: Diagnosis not present

## 2024-09-11 DIAGNOSIS — I160 Hypertensive urgency: Secondary | ICD-10-CM | POA: Diagnosis not present

## 2024-09-11 DIAGNOSIS — M1990 Unspecified osteoarthritis, unspecified site: Secondary | ICD-10-CM | POA: Diagnosis not present

## 2024-09-11 DIAGNOSIS — D631 Anemia in chronic kidney disease: Secondary | ICD-10-CM | POA: Diagnosis not present

## 2024-09-11 DIAGNOSIS — K219 Gastro-esophageal reflux disease without esophagitis: Secondary | ICD-10-CM | POA: Diagnosis not present

## 2024-09-11 DIAGNOSIS — Z79899 Other long term (current) drug therapy: Secondary | ICD-10-CM | POA: Diagnosis not present

## 2024-09-11 DIAGNOSIS — R0602 Shortness of breath: Secondary | ICD-10-CM | POA: Diagnosis not present

## 2024-09-11 DIAGNOSIS — R0600 Dyspnea, unspecified: Secondary | ICD-10-CM | POA: Diagnosis not present

## 2024-09-11 DIAGNOSIS — J441 Chronic obstructive pulmonary disease with (acute) exacerbation: Secondary | ICD-10-CM | POA: Diagnosis not present

## 2024-09-11 DIAGNOSIS — E43 Unspecified severe protein-calorie malnutrition: Secondary | ICD-10-CM | POA: Diagnosis not present

## 2024-09-11 DIAGNOSIS — N1832 Chronic kidney disease, stage 3b: Secondary | ICD-10-CM | POA: Diagnosis not present

## 2024-09-11 DIAGNOSIS — Z66 Do not resuscitate: Secondary | ICD-10-CM | POA: Diagnosis not present

## 2024-09-11 DIAGNOSIS — I130 Hypertensive heart and chronic kidney disease with heart failure and stage 1 through stage 4 chronic kidney disease, or unspecified chronic kidney disease: Secondary | ICD-10-CM | POA: Diagnosis not present

## 2024-09-11 DIAGNOSIS — J9601 Acute respiratory failure with hypoxia: Secondary | ICD-10-CM | POA: Diagnosis not present

## 2024-09-11 DIAGNOSIS — Z681 Body mass index (BMI) 19 or less, adult: Secondary | ICD-10-CM | POA: Diagnosis not present

## 2024-09-11 DIAGNOSIS — Z7951 Long term (current) use of inhaled steroids: Secondary | ICD-10-CM | POA: Diagnosis not present

## 2024-09-13 ENCOUNTER — Telehealth: Payer: Self-pay

## 2024-09-13 NOTE — Telephone Encounter (Signed)
-----   Message from Ramer R Madireddy sent at 09/12/2024  6:54 PM EDT ----- Blood pressure log from 07/31/2024 through 08/29/2024 reviewed. Blood pressures since discontinuing carvedilol have been somewhat elevated with systolics consistently above 150 mmHg.  With elevated heart rates in 90s. Would recommend resuming low-dose carvedilol 3.125 mg twice daily.  Thank you.

## 2024-09-13 NOTE — Telephone Encounter (Signed)
 No Answer. Will send MyChart message

## 2024-09-16 NOTE — Telephone Encounter (Signed)
 No answer or voice mail

## 2024-09-18 DIAGNOSIS — Z91018 Allergy to other foods: Secondary | ICD-10-CM | POA: Diagnosis not present

## 2024-09-18 DIAGNOSIS — M19041 Primary osteoarthritis, right hand: Secondary | ICD-10-CM | POA: Diagnosis not present

## 2024-09-18 DIAGNOSIS — I714 Abdominal aortic aneurysm, without rupture, unspecified: Secondary | ICD-10-CM | POA: Diagnosis not present

## 2024-09-18 DIAGNOSIS — K921 Melena: Secondary | ICD-10-CM | POA: Diagnosis not present

## 2024-09-18 DIAGNOSIS — E039 Hypothyroidism, unspecified: Secondary | ICD-10-CM | POA: Diagnosis not present

## 2024-09-18 DIAGNOSIS — Z91040 Latex allergy status: Secondary | ICD-10-CM | POA: Diagnosis not present

## 2024-09-18 DIAGNOSIS — Z79899 Other long term (current) drug therapy: Secondary | ICD-10-CM | POA: Diagnosis not present

## 2024-09-18 DIAGNOSIS — I110 Hypertensive heart disease with heart failure: Secondary | ICD-10-CM | POA: Diagnosis not present

## 2024-09-18 DIAGNOSIS — M19042 Primary osteoarthritis, left hand: Secondary | ICD-10-CM | POA: Diagnosis not present

## 2024-09-18 DIAGNOSIS — I5032 Chronic diastolic (congestive) heart failure: Secondary | ICD-10-CM | POA: Diagnosis not present

## 2024-09-18 DIAGNOSIS — K922 Gastrointestinal hemorrhage, unspecified: Secondary | ICD-10-CM | POA: Diagnosis not present

## 2024-09-18 DIAGNOSIS — K219 Gastro-esophageal reflux disease without esophagitis: Secondary | ICD-10-CM | POA: Diagnosis not present

## 2024-09-18 DIAGNOSIS — D649 Anemia, unspecified: Secondary | ICD-10-CM | POA: Diagnosis not present

## 2024-09-18 DIAGNOSIS — E43 Unspecified severe protein-calorie malnutrition: Secondary | ICD-10-CM | POA: Diagnosis not present

## 2024-09-18 DIAGNOSIS — Z7951 Long term (current) use of inhaled steroids: Secondary | ICD-10-CM | POA: Diagnosis not present

## 2024-09-18 DIAGNOSIS — R531 Weakness: Secondary | ICD-10-CM | POA: Diagnosis not present

## 2024-09-18 DIAGNOSIS — J449 Chronic obstructive pulmonary disease, unspecified: Secondary | ICD-10-CM | POA: Diagnosis not present

## 2024-09-18 DIAGNOSIS — Z681 Body mass index (BMI) 19 or less, adult: Secondary | ICD-10-CM | POA: Diagnosis not present

## 2024-09-18 MED ORDER — CARVEDILOL 3.125 MG PO TABS: 3.1250 mg | ORAL_TABLET | Freq: Two times a day (BID) | ORAL | 3 refills | Status: DC

## 2024-09-18 NOTE — Addendum Note (Signed)
 Addended by: ONEITA BERLINER on: 09/18/2024 09:04 AM   Modules accepted: Orders

## 2024-09-18 NOTE — Telephone Encounter (Signed)
 Results reviewed with pt as per Dr. Madireddy's note.  Pt verbalized understanding and had no additional questions.

## 2024-09-28 DIAGNOSIS — E441 Mild protein-calorie malnutrition: Secondary | ICD-10-CM | POA: Diagnosis not present

## 2024-09-28 DIAGNOSIS — R262 Difficulty in walking, not elsewhere classified: Secondary | ICD-10-CM | POA: Diagnosis not present

## 2024-09-28 DIAGNOSIS — J9621 Acute and chronic respiratory failure with hypoxia: Secondary | ICD-10-CM | POA: Diagnosis not present

## 2024-11-04 DEATH — deceased

## 2024-11-21 ENCOUNTER — Ambulatory Visit
